# Patient Record
Sex: Female | Born: 1945 | Race: Black or African American | Hispanic: No | Marital: Married | Smoking: Never smoker
Health system: Southern US, Community
[De-identification: ages and names within clinical notes are randomized; demographics above are authoritative.]

## PROBLEM LIST (undated history)

## (undated) DIAGNOSIS — I10 Essential (primary) hypertension: Secondary | ICD-10-CM

## (undated) DIAGNOSIS — J189 Pneumonia, unspecified organism: Secondary | ICD-10-CM

## (undated) HISTORY — PX: BACK SURGERY: SHX140

## (undated) HISTORY — PX: CERVICAL FUSION: SHX112

## (undated) HISTORY — PX: LAMINECTOMY: SHX219

---

## 1997-08-08 HISTORY — PX: LUMBAR FUSION: SHX111

## 2018-07-08 DIAGNOSIS — E0590 Thyrotoxicosis, unspecified without thyrotoxic crisis or storm: Secondary | ICD-10-CM

## 2018-07-08 HISTORY — DX: Thyrotoxicosis, unspecified without thyrotoxic crisis or storm: E05.90

## 2020-09-24 ENCOUNTER — Ambulatory Visit: Payer: Self-pay

## 2020-09-24 ENCOUNTER — Encounter: Payer: Self-pay | Admitting: Specialist

## 2020-09-24 ENCOUNTER — Other Ambulatory Visit: Payer: Self-pay

## 2020-09-24 ENCOUNTER — Ambulatory Visit: Payer: Medicare HMO | Admitting: Specialist

## 2020-09-24 VITALS — BP 109/65 | HR 69 | Ht 65.0 in | Wt 201.0 lb

## 2020-09-24 DIAGNOSIS — M5410 Radiculopathy, site unspecified: Secondary | ICD-10-CM

## 2020-09-24 DIAGNOSIS — Z981 Arthrodesis status: Secondary | ICD-10-CM

## 2020-09-24 DIAGNOSIS — M2392 Unspecified internal derangement of left knee: Secondary | ICD-10-CM

## 2020-09-24 DIAGNOSIS — M545 Low back pain, unspecified: Secondary | ICD-10-CM

## 2020-09-24 DIAGNOSIS — M4316 Spondylolisthesis, lumbar region: Secondary | ICD-10-CM | POA: Diagnosis not present

## 2020-09-24 NOTE — Progress Notes (Signed)
Office Visit Note   Patient: Meghan Gallegos           Date of Birth: [birth year]           MRN: [id number] Visit Date: 09/24/2020              Requested by: No referring provider defined for this encounter. PCP: Nicolasa Ducking, MD   Assessment & Plan: Visit Diagnoses:  1. Low back pain, unspecified back pain laterality, unspecified chronicity, unspecified whether sciatica present   2. Spondylolisthesis, lumbar region   3. History of lumbar fusion   4. Internal derangement of left knee   5. Radiculopathy, unspecified spinal region     Plan: Avoid bending, stooping and avoid lifting weights greater than 10 lbs. Avoid prolong standing and walking. Avoid frequent bending and stooping  No lifting greater than 10 lbs. May use ice or moist heat for pain. Weight loss is of benefit. Handicap license is approved. Dr. [place] Blas secretary/Assistant will call to arrange for epidural steroid injection   Follow-Up Instructions: Return in about 4 weeks (around 10/22/2020).   Orders:  Orders Placed This Encounter  Procedures  . XR Lumbar Spine 2-3 Views  . Ambulatory referral to Physical Medicine Rehab   No orders of the defined types were placed in this encounter.     Procedures: No procedures performed   Clinical Data: No additional findings.   Subjective: Chief Complaint  Patient presents with  . Lower Back - Pain    75 year old female with previous history of lumbar surgery in 1999 by a surgeon in [place]. Post op the lumbar spine continued to give her trouble and she was unable to return to work due to ongoing back pain. Over the last 7 months the pain is changed to left posterior and lateral thigh and knee and calf pain and pain into the left toes and the sole of the left foot. The pain increased. She has been in pain management and seeing MDs since 2000, In 2015 she moved from [place] to  [place] and is seeing the doctors there. No bowel or bladder difficulty. The left leg pain is  greater than the back pain. The meds she takes helps the pain in the back but not into the left leg. Has seen an orthopaedic surgeon sports med for the left knee the pain is not as would be seen with the MRI report. She had an ALF in [place] after first having a laminectomy and then had a recurrent disc herniation and had lamiinectomy then fusion the same day. She can walk a distance of short period but has difficulty with shopping and walking the entire grocery store without use of a cart to lean on. The pain into the left leg is worse with standing and walking and straightening the left knee improves the pain. Injection of the left knee helps to relieve the left knee pain and the use of a viscoelastic supplement helped for 3 months.    Review of Systems  Constitutional: Negative.   HENT: Negative.   Eyes: Negative.   Respiratory: Positive for shortness of breath and wheezing. Negative for apnea, cough, choking and chest tightness.   Cardiovascular: Negative.  Negative for chest pain, palpitations and leg swelling.  Gastrointestinal: Negative.  Negative for abdominal distention, abdominal pain, anal bleeding, blood in stool, constipation, diarrhea, nausea and rectal pain.  Endocrine: Negative.  Negative for cold intolerance, heat intolerance, polydipsia, polyphagia and polyuria.  Genitourinary: Negative.  Negative for difficulty  urinating, dyspareunia, dysuria, enuresis, flank pain, frequency and genital sores.  Musculoskeletal: Positive for arthralgias, back pain and gait problem. Negative for joint swelling, myalgias, neck pain and neck stiffness.  Skin: Negative.  Negative for color change, pallor, rash and wound.  Allergic/Immunologic: Positive for environmental allergies.  Neurological: Positive for weakness and numbness. Negative for dizziness, tremors, seizures, syncope, facial asymmetry, speech difficulty, light-headedness and headaches.  Hematological: Negative.  Negative for adenopathy. Does  not bruise/bleed easily.  Psychiatric/Behavioral: Negative.  Negative for agitation, behavioral problems, confusion, decreased concentration, dysphoric mood, hallucinations, self-injury, sleep disturbance and suicidal ideas. The patient is not nervous/anxious and is not hyperactive.      Objective: Vital Signs: BP 109/65 (BP Location: Left Arm, Patient Position: Sitting)   Pulse 69   Ht 5\' 5"  (1.651 m)   Wt 201 lb (91.2 kg)   BMI 33.45 kg/m   Physical Exam  Ortho Exam  Specialty Comments:  No specialty comments available.  Imaging: No results found.   PMFS History: There are no problems to display for this patient.  History reviewed. No pertinent past medical history.  History reviewed. No pertinent family history.  History reviewed. No pertinent surgical history. Social History   Occupational History  . Not on file  Tobacco Use  . Smoking status: Never Smoker  . Smokeless tobacco: Never Used  Vaping Use  . Vaping Use: Not on file  Substance and Sexual Activity  . Alcohol use: Never  . Drug use: Never  . Sexual activity: Not on file

## 2020-09-24 NOTE — Patient Instructions (Signed)
Avoid bending, stooping and avoid lifting weights greater than 10 lbs. Avoid prolong standing and walking. Avoid frequent bending and stooping  No lifting greater than 10 lbs. May use ice or moist heat for pain. Weight loss is of benefit. Handicap license is approved. Dr. Newton's secretary/Assistant will call to arrange for epidural steroid injection  

## 2020-09-29 ENCOUNTER — Telehealth: Payer: Self-pay | Admitting: Physical Medicine and Rehabilitation

## 2020-09-29 NOTE — Telephone Encounter (Signed)
Patient called to schedule an appointment with Dr. Alvester Morin. Patient said she received a text to call and make an appointment. The number to contact patient is  [phone]

## 2020-09-29 NOTE — Telephone Encounter (Signed)
Pt called stating she would like to go ahead and get her appt for a nerve block scheduled.  [phone]

## 2020-09-29 NOTE — Telephone Encounter (Signed)
Called pt and sch 3/7 

## 2020-09-29 NOTE — Telephone Encounter (Signed)
Called pt and sch 3/7

## 2020-10-12 ENCOUNTER — Encounter: Payer: Self-pay | Admitting: Physical Medicine and Rehabilitation

## 2020-10-12 ENCOUNTER — Ambulatory Visit: Payer: Self-pay

## 2020-10-12 ENCOUNTER — Other Ambulatory Visit: Payer: Self-pay

## 2020-10-12 ENCOUNTER — Ambulatory Visit (INDEPENDENT_AMBULATORY_CARE_PROVIDER_SITE_OTHER): Payer: Medicare HMO | Admitting: Physical Medicine and Rehabilitation

## 2020-10-12 VITALS — BP 120/74 | HR 76

## 2020-10-12 DIAGNOSIS — M5416 Radiculopathy, lumbar region: Secondary | ICD-10-CM | POA: Diagnosis not present

## 2020-10-12 MED ORDER — METHYLPREDNISOLONE ACETATE 80 MG/ML IJ SUSP
80.0000 mg | Freq: Once | INTRAMUSCULAR | Status: AC
  Administered 2020-10-12: 80 mg

## 2020-10-12 NOTE — Progress Notes (Signed)
Pt state lower back pain that travels down the positier left leg to her foot. Pt state walking, standing and sitting makes the pain worse. Pt state she takes pain meds to help ease the pain.  Numeric Pain Rating Scale and Functional Assessment Average Pain 4   In the last MONTH (on 0-10 scale) has pain interfered with the following?  1. General activity like being  able to carry out your everyday physical activities such as walking, climbing stairs, carrying groceries, or moving a chair?  Rating(7)   +Driver, -BT, -Dye Allergies.

## 2020-10-12 NOTE — Patient Instructions (Signed)

## 2020-10-13 NOTE — Progress Notes (Signed)
Meghan Gallegos - 75 y.o. female MRN [id number]  Date of birth: 13-Mar-1946  Office Visit Note: Visit Date: 10/12/2020 PCP: Nicolasa Ducking, MD Referred by: Nicolasa Ducking, MD  Subjective: Chief Complaint  Patient presents with  . Lower Back - Pain  . Left Leg - Pain  . Left Foot - Pain   HPI:  Meghan Gallegos is a 75 y.o. female who comes in today at the request of Dr. Vira Browns for planned Left L4-L5 Lumbar epidural steroid injection with fluoroscopic guidance.  The patient has failed conservative care including home exercise, medications, time and activity modification.  This injection will be diagnostic and hopefully therapeutic.  Please see requesting physician notes for further details and justification. MRI reviewed with images and spine model.  MRI reviewed in the note below.  Prior lumbar fusion at L5-S1.  Symptoms left posterior lateral leg pain more of an L5-S1 distribution.   ROS Otherwise per HPI.  Assessment & Plan: Visit Diagnoses:    ICD-10-CM   1. Lumbar radiculopathy  M54.16 XR C-ARM NO REPORT    Epidural Steroid injection    methylPREDNISolone acetate (DEPO-MEDROL) injection 80 mg    Plan: No additional findings.   Meds & Orders:  Meds ordered this encounter  Medications  . methylPREDNISolone acetate (DEPO-MEDROL) injection 80 mg    Orders Placed This Encounter  Procedures  . XR C-ARM NO REPORT  . Epidural Steroid injection    Follow-up: Return in about 2 weeks (around 10/26/2020) for Vira Browns, MD.   Procedures: No procedures performed  Lumbosacral Transforaminal Epidural Steroid Injection - Sub-Pedicular Approach with Fluoroscopic Guidance  Patient: Meghan Gallegos      Date of Birth: 08-03-1946 MRN: [id number] PCP: Nicolasa Ducking, MD      Visit Date: 10/12/2020   Universal Protocol:    Date/Time: 10/12/2020  Consent Given By: the patient  Position: PRONE  Additional Comments: Vital signs were monitored before and after the procedure. Patient  was prepped and draped in the usual sterile fashion. The correct patient, procedure, and site was verified.   Injection Procedure Details:   Procedure diagnoses: Lumbar radiculopathy [M54.16]    Meds Administered:  Meds ordered this encounter  Medications  . methylPREDNISolone acetate (DEPO-MEDROL) injection 80 mg    Laterality: Left  Location/Site:  L4-L5  Needle:5.0 in., 22 ga.  Short bevel or Quincke spinal needle  Needle Placement: Transforaminal  Findings:    -Comments: Excellent flow of contrast along the nerve, nerve root and into the epidural space.  Procedure Details: After squaring off the end-plates to get a true AP view, the C-arm was positioned so that an oblique view of the foramen as noted above was visualized. The target area is just inferior to the "nose of the scotty dog" or sub pedicular. The soft tissues overlying this structure were infiltrated with 2-3 ml. of 1% Lidocaine without Epinephrine.  The spinal needle was inserted toward the target using a "trajectory" view along the fluoroscope beam.  Under AP and lateral visualization, the needle was advanced so it did not puncture dura and was located close the 6 O'Clock position of the pedical in AP tracterory. Biplanar projections were used to confirm position. Aspiration was confirmed to be negative for CSF and/or blood. A 1-2 ml. volume of Isovue-250 was injected and flow of contrast was noted at each level. Radiographs were obtained for documentation purposes.   After attaining the desired flow of contrast documented above, a 0.5 to 1.0 ml test dose of  0.25% Marcaine was injected into each respective transforaminal space.  The patient was observed for 90 seconds post injection.  After no sensory deficits were reported, and normal lower extremity motor function was noted,   the above injectate was administered so that equal amounts of the injectate were placed at each foramen (level) into the transforaminal  epidural space.   Additional Comments:  The patient tolerated the procedure well Dressing: 2 x 2 sterile gauze and Band-Aid    Post-procedure details: Patient was observed during the procedure. Post-procedure instructions were reviewed.  Patient left the clinic in stable condition.      Clinical History: MRI LUMBAR SPINE WITHOUT AND WITH CONTRAST   TECHNIQUE:  Multiplanar and multiecho pulse sequences of the lumbar spine were  obtained without and with intravenous contrast.   CONTRAST: 18 mL MultiHance IV   COMPARISON: None.   FINDINGS:  Segmentation: Normal   Alignment: 5 mm anterolisthesis L4-5. Mild retrolisthesis L5-S1.   Vertebrae: Negative for fracture or mass.   Conus medullaris and cauda equina: Conus extends to the L1-2 level.  Conus and cauda equina appear normal.   Paraspinal and other soft tissues: Negative for paraspinous mass or  adenopathy. Multiplebilateral renal cysts.   Disc levels:   L1-2: Negative   L2-3: Mild facet degeneration. Normal disc space. Negative for  stenosis   L3-4: Normal disc space. Mild facet degeneration. Negative for  stenosis   L4-5: 5 mm anterolisthesis. No disc protrusion. Moderate to advanced  facet hypertrophy bilaterally with no significant spinal or  foraminal stenosis   L5-S1: Bilateral rate cage fusion. Posterior laminectomy. Negative  for spinal or foraminal stenosis.   IMPRESSION:  5 mm anterolisthesis L4-5 with moderate to advanced facet  degeneration. No significant stenosis.   Bilateral rate cage fusion L5-S1. Negative for stenosis.    Electronically Signed   By: Marlan Palau M.D.   On: 06/14/2020 15:25     Objective:  VS:  HT:    WT:   BMI:     BP:120/74  HR:76bpm  TEMP: ( )  RESP:  Physical Exam Vitals and nursing note reviewed.  Constitutional:      General: She is not in acute distress.    Appearance: Normal appearance. She is not ill-appearing.  HENT:     Head:  Normocephalic and atraumatic.     Right Ear: External ear normal.     Left Ear: External ear normal.  Eyes:     Extraocular Movements: Extraocular movements intact.  Cardiovascular:     Rate and Rhythm: Normal rate.     Pulses: Normal pulses.  Pulmonary:     Effort: Pulmonary effort is normal. No respiratory distress.  Abdominal:     General: There is no distension.     Palpations: Abdomen is soft.  Musculoskeletal:        General: Tenderness present.     Cervical back: Neck supple.     Right lower leg: No edema.     Left lower leg: No edema.     Comments: Patient has good distal strength with no pain over the greater trochanters.  No clonus or focal weakness.  Skin:    Findings: No erythema, lesion or rash.  Neurological:     General: No focal deficit present.     Mental Status: She is alert and oriented to person, place, and time.     Sensory: No sensory deficit.     Motor: No weakness or abnormal muscle tone.  Coordination: Coordination normal.  Psychiatric:        Mood and Affect: Mood normal.        Behavior: Behavior normal.      Imaging: XR C-ARM NO REPORT  Result Date: 10/12/2020 Please see Notes tab for imaging impression.

## 2020-10-13 NOTE — Procedures (Signed)
Lumbosacral Transforaminal Epidural Steroid Injection - Sub-Pedicular Approach with Fluoroscopic Guidance  Patient: Meghan Gallegos      Date of Birth: 11/06/1945 MRN: [id number] PCP: Nicolasa Ducking, MD      Visit Date: 10/12/2020   Universal Protocol:    Date/Time: 10/12/2020  Consent Given By: the patient  Position: PRONE  Additional Comments: Vital signs were monitored before and after the procedure. Patient was prepped and draped in the usual sterile fashion. The correct patient, procedure, and site was verified.   Injection Procedure Details:   Procedure diagnoses: Lumbar radiculopathy [M54.16]    Meds Administered:  Meds ordered this encounter  Medications  . methylPREDNISolone acetate (DEPO-MEDROL) injection 80 mg    Laterality: Left  Location/Site:  L4-L5  Needle:5.0 in., 22 ga.  Short bevel or Quincke spinal needle  Needle Placement: Transforaminal  Findings:    -Comments: Excellent flow of contrast along the nerve, nerve root and into the epidural space.  Procedure Details: After squaring off the end-plates to get a true AP view, the C-arm was positioned so that an oblique view of the foramen as noted above was visualized. The target area is just inferior to the "nose of the scotty dog" or sub pedicular. The soft tissues overlying this structure were infiltrated with 2-3 ml. of 1% Lidocaine without Epinephrine.  The spinal needle was inserted toward the target using a "trajectory" view along the fluoroscope beam.  Under AP and lateral visualization, the needle was advanced so it did not puncture dura and was located close the 6 O'Clock position of the pedical in AP tracterory. Biplanar projections were used to confirm position. Aspiration was confirmed to be negative for CSF and/or blood. A 1-2 ml. volume of Isovue-250 was injected and flow of contrast was noted at each level. Radiographs were obtained for documentation purposes.   After attaining the desired  flow of contrast documented above, a 0.5 to 1.0 ml test dose of 0.25% Marcaine was injected into each respective transforaminal space.  The patient was observed for 90 seconds post injection.  After no sensory deficits were reported, and normal lower extremity motor function was noted,   the above injectate was administered so that equal amounts of the injectate were placed at each foramen (level) into the transforaminal epidural space.   Additional Comments:  The patient tolerated the procedure well Dressing: 2 x 2 sterile gauze and Band-Aid    Post-procedure details: Patient was observed during the procedure. Post-procedure instructions were reviewed.  Patient left the clinic in stable condition.

## 2020-11-06 ENCOUNTER — Ambulatory Visit: Payer: Medicare HMO | Admitting: Specialist

## 2020-11-06 ENCOUNTER — Encounter: Payer: Self-pay | Admitting: Specialist

## 2020-11-06 ENCOUNTER — Other Ambulatory Visit: Payer: Self-pay

## 2020-11-06 VITALS — BP 132/63 | HR 57 | Ht 65.0 in | Wt 201.0 lb

## 2020-11-06 DIAGNOSIS — Z4889 Encounter for other specified surgical aftercare: Secondary | ICD-10-CM

## 2020-11-06 DIAGNOSIS — Z981 Arthrodesis status: Secondary | ICD-10-CM | POA: Diagnosis not present

## 2020-11-06 DIAGNOSIS — M4316 Spondylolisthesis, lumbar region: Secondary | ICD-10-CM | POA: Diagnosis not present

## 2020-11-06 DIAGNOSIS — M5410 Radiculopathy, site unspecified: Secondary | ICD-10-CM | POA: Diagnosis not present

## 2020-11-06 DIAGNOSIS — M4807 Spinal stenosis, lumbosacral region: Secondary | ICD-10-CM

## 2020-11-06 MED ORDER — MELOXICAM 15 MG PO TABS: 15.0000 mg | ORAL_TABLET | Freq: Every day | ORAL | 2 refills | Status: DC

## 2020-11-06 NOTE — Progress Notes (Signed)
Office Visit Note   Patient: Meghan Gallegos           Date of Birth: 08-18-1945           MRN: [id number] Visit Date: 11/06/2020              Requested by: Nicolasa Ducking, MD [address] [place],  [place] [address] PCP: Nicolasa Ducking, MD   Assessment & Plan: Visit Diagnoses:  1. Spondylolisthesis, lumbar region   2. History of lumbar fusion   3. Radiculopathy, unspecified spinal region   4. Encounter for other specified surgical aftercare   5. Spinal stenosis of lumbosacral region   75 year  Old female with chronic back and leg pain and chronic pain management, She underwent recent radiographs and MRI demonstrating an L4-5 spondylolisthesis but MRI did not demonstrate nerve compression. Her radiographs show a more significant  Spondylolisthesis than is seen in the MRI and it is likely that there is a dynamic shifing that is occurring when she is upright and with  Bending. A lumbar Myelogram and post myelogram CT may be more helpful in demonstrating the nerve compression often present with the degree of spondylolithesis seen her. The SNRB was not able to relief a majority of the pain she is experiencing so I am recommending a myelogram and post myelogram CT with upright mylelogram images to demontrate the degree of spondylolishesis and nerve compression.  Plan: Avoid bending, stooping and avoid lifting weights greater than 10 lbs. Avoid prolong standing and walking. Avoid frequent bending and stooping  No lifting greater than 10 lbs. May use ice or moist heat for pain. Weight loss is of benefit. Handicap license is approved. Lumbar myelogram and post myelogram CT scan to assess. Follow-Up Instructions: Return in about 4 weeks (around 12/04/2020).   Orders:  No orders of the defined types were placed in this encounter.  No orders of the defined types were placed in this encounter.     Procedures: No procedures performed   Clinical Data: No additional  findings.   Subjective: Chief Complaint  Patient presents with  . Lower Back - Follow-up    She had a Lt L4-5 Tf injection on 10/12/20 with Dr. Alvester Morin and she states that she got 30% relief from the injection    75 year old female with history of back pain and radiation into the left leg. She is taking hydrocodone and morphine every 4 hours from primary care in [place] controling the narcotics. She had injection left L4-5 by Dr. Alvester Morin TF L4 and she reports that the pain in the left  Leg was not improved but she is still having tightness in the left posterior thigh but the pain is still there. She is not having bladder difficulty but does take a natural remedy for constipation. She has limitation of her standing and walking tolerance. 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 Scar from first surgery is of concern but not sure that it causes all her pain. I year ago the pain worsened.    Review of Systems  Constitutional: Negative.   HENT: Negative.   Eyes: Negative.   Respiratory: Negative.   Cardiovascular: Negative.   Gastrointestinal: Negative.   Endocrine: Negative.   Genitourinary: Negative.   Musculoskeletal: Negative.   Skin: Negative.   Allergic/Immunologic: Negative.   Neurological: Negative.   Hematological: Negative.   Psychiatric/Behavioral: Negative.      Objective: Vital Signs: BP 132/63 (BP Location: Left Arm, Patient Position: Sitting)  Pulse (!) 57   Ht 5\' 5"  (1.651 m)   Wt 201 lb (91.2 kg)   BMI 33.45 kg/m   Physical Exam Constitutional:      Appearance: She is well-developed.  HENT:     Head: Normocephalic and atraumatic.  Eyes:     Pupils: Pupils are equal, round, and reactive to light.  Pulmonary:     Effort: Pulmonary effort is normal.     Breath sounds: Normal breath sounds.  Abdominal:     General: Bowel sounds are normal.     Palpations: Abdomen is soft.  Musculoskeletal:     Cervical back: Normal range of motion and neck supple.      Lumbar back: Negative right straight leg raise test and negative left straight leg raise test.  Skin:    General: Skin is warm and dry.  Neurological:     Mental Status: She is alert and oriented to person, place, and time.  Psychiatric:        Behavior: Behavior normal.        Thought Content: Thought content normal.        Judgment: Judgment normal.     Back Exam   Tenderness  The patient is experiencing tenderness in the lumbar.  Range of Motion  Extension: abnormal  Flexion: normal  Lateral bend right: normal  Lateral bend left: normal  Rotation right: abnormal  Rotation left: abnormal   Muscle Strength  Right Quadriceps:  5/5  Right Hamstrings:  5/5   Tests  Straight leg raise right: negative Straight leg raise left: negative  Other  Toe walk: normal Heel walk: normal Sensation: normal Gait: normal       Specialty Comments:  No specialty comments available.  Imaging: No results found.   PMFS History: There are no problems to display for this patient.  No past medical history on file.  No family history on file.  No past surgical history on file. Social History   Occupational History  . Not on file  Tobacco Use  . Smoking status: Never Smoker  . Smokeless tobacco: Never Used  Vaping Use  . Vaping Use: Not on file  Substance and Sexual Activity  . Alcohol use: Never  . Drug use: Never  . Sexual activity: Not on file

## 2020-11-06 NOTE — Patient Instructions (Signed)
Plan: Avoid bending, stooping and avoid lifting weights greater than 10 lbs. Avoid prolong standing and walking. Avoid frequent bending and stooping  No lifting greater than 10 lbs. May use ice or moist heat for pain. Weight loss is of benefit. Handicap license is approved. Lumbar myelogram and post myelogram CT scan to assess.

## 2021-02-12 ENCOUNTER — Encounter: Payer: Self-pay | Admitting: Specialist

## 2021-02-12 ENCOUNTER — Other Ambulatory Visit: Payer: Self-pay

## 2021-02-12 ENCOUNTER — Ambulatory Visit: Payer: Medicare HMO | Admitting: Specialist

## 2021-02-12 VITALS — BP 122/67 | HR 64 | Ht 65.0 in | Wt 201.0 lb

## 2021-02-12 DIAGNOSIS — M4316 Spondylolisthesis, lumbar region: Secondary | ICD-10-CM | POA: Diagnosis not present

## 2021-02-12 DIAGNOSIS — M5410 Radiculopathy, site unspecified: Secondary | ICD-10-CM

## 2021-02-12 DIAGNOSIS — M4807 Spinal stenosis, lumbosacral region: Secondary | ICD-10-CM

## 2021-02-12 DIAGNOSIS — M2392 Unspecified internal derangement of left knee: Secondary | ICD-10-CM | POA: Diagnosis not present

## 2021-02-12 DIAGNOSIS — Z4889 Encounter for other specified surgical aftercare: Secondary | ICD-10-CM

## 2021-02-12 DIAGNOSIS — Z981 Arthrodesis status: Secondary | ICD-10-CM

## 2021-02-12 DIAGNOSIS — M545 Low back pain, unspecified: Secondary | ICD-10-CM

## 2021-02-12 NOTE — Progress Notes (Signed)
Office Visit Note   Patient: Meghan Gallegos           Date of Birth: 1946/04/10           MRN: [id number] Visit Date: 02/12/2021              Requested by: Nicolasa Ducking, MD [address] [place],  [place] [address] PCP: Nicolasa Ducking, MD   Assessment & Plan: Visit Diagnoses:  1. Spondylolisthesis, lumbar region   2. History of lumbar fusion   3. Spinal stenosis of lumbosacral region   4. Internal derangement of left knee   5. Radiculopathy, unspecified spinal region   6. Low back pain, unspecified back pain laterality, unspecified chronicity, unspecified whether sciatica present   7. Encounter for other specified surgical aftercare       Plan:Avoid bending, stooping and avoid lifting weights greater than 10 lbs. Avoid prolong standing and walking. Avoid frequent bending and stooping  No lifting greater than 10 lbs. May use ice or moist heat for pain. Weight loss is of benefit. Handicap license is approved. Dr. [place] Blas secretary/Assistant will call to arrange for epidural steroid injection   You have spinal narrowing and nerve compression within the opening at the level of the shift or anterolisthesis  At L4-5, your neurologic exam is normal, Surgery would improve your standing and walking tolerance but will not  Make your spine normal. 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  Lumbar flexion exercises.Low Back Sprain or Strain Rehab Ask your health care provider which exercises are safe for you. Do exercises exactly as told by your health care provider and adjust them as directed. It is normal to feel mild stretching, pulling, tightness, or discomfort as you do these exercises. Stop right away if you feel sudden pain or your pain gets worse. Do not begin these exercises until told by your health care  provider. Stretching and range-of-motion exercises These exercises warm up your muscles and joints and improve the movement and flexibility of your back. These exercises also help to relieve pain, numbness,and tingling. Lumbar rotation  Lie on your back on a firm surface and bend your knees. Straighten your arms out to your sides so each arm forms a 90-degree angle (right angle) with a side of your body. Slowly move (rotate) both of your knees to one side of your body until you feel a stretch in your lower back (lumbar). Try not to let your shoulders lift off the floor. Hold this position for __________ seconds. Tense your abdominal muscles and slowly move your knees back to the starting position. Repeat this exercise on the other side of your body. Repeat __________ times. Complete this exercise __________ times a day. Single knee to chest  Lie on your back on a firm surface with both legs straight. Bend one of your knees. Use your hands to move your knee up toward your chest until you feel a gentle stretch in your lower back and buttock. Hold your leg in this position by holding on to the front of your knee. Keep your other leg as straight as possible. Hold this position for __________ seconds. Slowly return to the starting position. Repeat with your other leg. Repeat __________ times. Complete this exercise __________ times a day. Prone extension on elbows  Lie on your abdomen on a firm surface (  prone position). Prop yourself up on your elbows. Use your arms to help lift your chest up until you feel a gentle stretch in your abdomen and your lower back. This will place some of your body weight on your elbows. If this is uncomfortable, try stacking pillows under your chest. Your hips should stay down, against the surface that you are lying on. Keep your hip and back muscles relaxed. Hold this position for __________ seconds. Slowly relax your upper body and return to the starting  position. Repeat __________ times. Complete this exercise __________ times a day. Strengthening exercises These exercises build strength and endurance in your back. Endurance is theability to use your muscles for a long time, even after they get tired. Pelvic tilt This exercise strengthens the muscles that lie deep in the abdomen. Lie on your back on a firm surface. Bend your knees and keep your feet flat on the floor. Tense your abdominal muscles. Tip your pelvis up toward the ceiling and flatten your lower back into the floor. To help with this exercise, you may place a small towel under your lower back and try to push your back into the towel. Hold this position for __________ seconds. Let your muscles relax completely before you repeat this exercise. Repeat __________ times. Complete this exercise __________ times a day. Alternating arm and leg raises  Get on your hands and knees on a firm surface. If you are on a hard floor, you may want to use padding, such as an exercise mat, to cushion your knees. Line up your arms and legs. Your hands should be directly below your shoulders, and your knees should be directly below your hips. Lift your left leg behind you. At the same time, raise your right arm and straighten it in front of you. Do not lift your leg higher than your hip. Do not lift your arm higher than your shoulder. Keep your abdominal and back muscles tight. Keep your hips facing the ground. Do not arch your back. Keep your balance carefully, and do not hold your breath. Hold this position for __________ seconds. Slowly return to the starting position. Repeat with your right leg and your left arm. Repeat __________ times. Complete this exercise __________ times a day. Abdominal set with straight leg raise  Lie on your back on a firm surface. Bend one of your knees and keep your other leg straight. Tense your abdominal muscles and lift your straight leg up, 4-6 inches (10-15  cm) off the ground. Keep your abdominal muscles tight and hold this position for __________ seconds. Do not hold your breath. Do not arch your back. Keep it flat against the ground. Keep your abdominal muscles tense as you slowly lower your leg back to the starting position. Repeat with your other leg. Repeat __________ times. Complete this exercise __________ times a day. Single leg lower with bent knees Lie on your back on a firm surface. Tense your abdominal muscles and lift your feet off the floor, one foot at a time, so your knees and hips are bent in 90-degree angles (right angles). Your knees should be over your hips and your lower legs should be parallel to the floor. Keeping your abdominal muscles tense and your knee bent, slowly lower one of your legs so your toe touches the ground. Lift your leg back up to return to the starting position. Do not hold your breath. Do not let your back arch. Keep your back flat against the ground. Repeat with  your other leg. Repeat __________ times. Complete this exercise __________ times a day. Posture and body mechanics Good posture and healthy body mechanics can help to relieve stress in your body's tissues and joints. Body mechanics refers to the movements and positions of your body while you do your daily activities. Posture is part of body mechanics. Good posture means: Your spine is in its natural S-curve position (neutral). Your shoulders are pulled back slightly. Your head is not tipped forward. Follow these guidelines to improve your posture and body mechanics in youreveryday activities. Standing  When standing, keep your spine neutral and your feet about hip width apart. Keep a slight bend in your knees. Your ears, shoulders, and hips should line up. When you do a task in which you stand in one place for a long time, place one foot up on a stable object that is 2-4 inches (5-10 cm) high, such as a footstool. This helps keep your spine  neutral.  Sitting  When sitting, keep your spine neutral and keep your feet flat on the floor. Use a footrest, if necessary, and keep your thighs parallel to the floor. Avoid rounding your shoulders, and avoid tilting your head forward. When working at a desk or a computer, keep your desk at a height where your hands are slightly lower than your elbows. Slide your chair under your desk so you are close enough to maintain good posture. When working at a computer, place your monitor at a height where you are looking straight ahead and you do not have to tilt your head forward or downward to look at the screen.  Resting When lying down and resting, avoid positions that are most painful for you. If you have pain with activities such as sitting, bending, stooping, or squatting, lie in a position in which your body does not bend very much. For example, avoid curling up on your side with your arms and knees near your chest (fetal position). If you have pain with activities such as standing for a long time or reaching with your arms, lie with your spine in a neutral position and bend your knees slightly. Try the following positions: Lying on your side with a pillow between your knees. Lying on your back with a pillow under your knees. Lifting  When lifting objects, keep your feet at least shoulder width apart and tighten your abdominal muscles. Bend your knees and hips and keep your spine neutral. It is important to lift using the strength of your legs, not your back. Do not lock your knees straight out. Always ask for help to lift heavy or awkward objects.  This information is not intended to replace advice given to you by your health care provider. Make sure you discuss any questions you have with your healthcare provider. Document Revised: 11/16/2018 Document Reviewed: 08/16/2018 Elsevier Patient Education  2022 [employer]Up Instructions: Return in about 4 weeks (around 03/12/2021).   Orders:   Orders Placed This Encounter  Procedures   Ambulatory referral to Physical Medicine Rehab   No orders of the defined types were placed in this encounter.     Procedures: No procedures performed   Clinical Data: No additional findings.   Subjective: Chief Complaint  Patient presents with   Lower Back - Follow-up    CT Myelogram review of LSP    75 year old female with history of previous lumbar fusion L5-S1 in [place] in the 1990s. Which went on to heal, anterior Ray cage surgery.  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 The injection helped for about 2-3 months. She has undergone recent myelogram and post myelogram CT of the lumbar spine.    Review of Systems  Constitutional: Negative.   HENT: Negative.    Eyes: Negative.   Respiratory: Negative.    Cardiovascular: Negative.   Gastrointestinal: Negative.   Endocrine: Negative.   Genitourinary: Negative.   Musculoskeletal: Negative.   Skin: Negative.   Allergic/Immunologic: Negative.   Neurological: Negative.   Hematological: Negative.   Psychiatric/Behavioral: Negative.      Objective: Vital Signs: BP 122/67   Pulse 64   Ht 5\' 5"  (1.651 m)   Wt 201 lb (91.2 kg)   BMI 33.45 kg/m   Physical Exam Constitutional:      Appearance: She is well-developed.  HENT:     Head: Normocephalic and atraumatic.  Eyes:     Pupils: Pupils are equal, round, and reactive to light.  Pulmonary:     Effort: Pulmonary effort is normal.     Breath sounds: Normal breath sounds.  Abdominal:     General: Bowel sounds are normal.     Palpations: Abdomen is soft.  Musculoskeletal:     Cervical back: Normal range of motion and neck supple.     Lumbar back: Negative right straight leg raise test and negative left straight leg raise test.  Skin:    General: Skin is warm and dry.   Neurological:     Mental Status: She is alert and oriented to person, place, and time.  Psychiatric:        Behavior: Behavior normal.        Thought Content: Thought content normal.        Judgment: Judgment normal.    Back Exam   Tenderness  The patient is experiencing tenderness in the lumbar.  Range of Motion  Extension:  abnormal  Flexion:  abnormal  Lateral bend right:  abnormal  Lateral bend left:  abnormal  Rotation right:  abnormal  Rotation left:  abnormal   Muscle Strength  Left Quadriceps:  5/5  Right Hamstrings:  5/5  Left Hamstrings:  5/5   Tests  Straight leg raise right: negative Straight leg raise left: negative  Other  Toe walk: normal Heel walk: normal Sensation: normal Erythema: no back redness Scars: present     Specialty Comments:  No specialty comments available.  Imaging: No results found.   PMFS History: There are no problems to display for this patient.  History reviewed. No pertinent past medical history.  History reviewed. No pertinent family history.  History reviewed. No pertinent surgical history. Social History   Occupational History   Not on file  Tobacco Use   Smoking status: Never   Smokeless tobacco: Never  Vaping Use   Vaping Use: Not on file  Substance and Sexual Activity   Alcohol use: Never   Drug use: Never   Sexual activity: Not on file

## 2021-02-12 NOTE — Patient Instructions (Signed)
Avoid bending, stooping and avoid lifting weights greater than 10 lbs. Avoid prolong standing and walking. Avoid frequent bending and stooping  No lifting greater than 10 lbs. May use ice or moist heat for pain. Weight loss is of benefit. Handicap license is approved. Dr. [place] Blas secretary/Assistant will call to arrange for epidural steroid injection   You have spinal narrowing and nerve compression within the opening at the level of the shift or anterolisthesis  At L4-5, your neurologic exam is normal, Surgery would improve your standing and walking tolerance but will not  Make your spine normal. 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  Lumbar flexion exercises.Low Back Sprain or Strain Rehab Ask your health care provider which exercises are safe for you. Do exercises exactly as told by your health care provider and adjust them as directed. It is normal to feel mild stretching, pulling, tightness, or discomfort as you do these exercises. Stop right away if you feel sudden pain or your pain gets worse. Do not begin these exercises until told by your health care provider. Stretching and range-of-motion exercises These exercises warm up your muscles and joints and improve the movement and flexibility of your back. These exercises also help to relieve pain, numbness,and tingling. Lumbar rotation  Lie on your back on a firm surface and bend your knees. Straighten your arms out to your sides so each arm forms a 90-degree angle (right angle) with a side of your body. Slowly move (rotate) both of your knees to one side of your body until you feel a stretch in your lower back (lumbar). Try not to let your shoulders lift off the floor. Hold this position for __________ seconds. Tense your abdominal muscles and slowly move your knees back to  the starting position. Repeat this exercise on the other side of your body. Repeat __________ times. Complete this exercise __________ times a day. Single knee to chest  Lie on your back on a firm surface with both legs straight. Bend one of your knees. Use your hands to move your knee up toward your chest until you feel a gentle stretch in your lower back and buttock. Hold your leg in this position by holding on to the front of your knee. Keep your other leg as straight as possible. Hold this position for __________ seconds. Slowly return to the starting position. Repeat with your other leg. Repeat __________ times. Complete this exercise __________ times a day. Prone extension on elbows  Lie on your abdomen on a firm surface (prone position). Prop yourself up on your elbows. Use your arms to help lift your chest up until you feel a gentle stretch in your abdomen and your lower back. This will place some of your body weight on your elbows. If this is uncomfortable, try stacking pillows under your chest. Your hips should stay down, against the surface that you are lying on. Keep your hip and back muscles relaxed. Hold this position for __________ seconds. Slowly relax your upper body and return to the starting position. Repeat __________ times. Complete this exercise __________ times a day. Strengthening exercises These exercises build strength and endurance in your back. Endurance is theability to use your muscles for a long time, even after they get tired. Pelvic tilt This exercise strengthens the muscles that lie deep in the abdomen. Lie on your  back on a firm surface. Bend your knees and keep your feet flat on the floor. Tense your abdominal muscles. Tip your pelvis up toward the ceiling and flatten your lower back into the floor. To help with this exercise, you may place a small towel under your lower back and try to push your back into the towel. Hold this position for __________  seconds. Let your muscles relax completely before you repeat this exercise. Repeat __________ times. Complete this exercise __________ times a day. Alternating arm and leg raises  Get on your hands and knees on a firm surface. If you are on a hard floor, you may want to use padding, such as an exercise mat, to cushion your knees. Line up your arms and legs. Your hands should be directly below your shoulders, and your knees should be directly below your hips. Lift your left leg behind you. At the same time, raise your right arm and straighten it in front of you. Do not lift your leg higher than your hip. Do not lift your arm higher than your shoulder. Keep your abdominal and back muscles tight. Keep your hips facing the ground. Do not arch your back. Keep your balance carefully, and do not hold your breath. Hold this position for __________ seconds. Slowly return to the starting position. Repeat with your right leg and your left arm. Repeat __________ times. Complete this exercise __________ times a day. Abdominal set with straight leg raise  Lie on your back on a firm surface. Bend one of your knees and keep your other leg straight. Tense your abdominal muscles and lift your straight leg up, 4-6 inches (10-15 cm) off the ground. Keep your abdominal muscles tight and hold this position for __________ seconds. Do not hold your breath. Do not arch your back. Keep it flat against the ground. Keep your abdominal muscles tense as you slowly lower your leg back to the starting position. Repeat with your other leg. Repeat __________ times. Complete this exercise __________ times a day. Single leg lower with bent knees Lie on your back on a firm surface. Tense your abdominal muscles and lift your feet off the floor, one foot at a time, so your knees and hips are bent in 90-degree angles (right angles). Your knees should be over your hips and your lower legs should be parallel to the  floor. Keeping your abdominal muscles tense and your knee bent, slowly lower one of your legs so your toe touches the ground. Lift your leg back up to return to the starting position. Do not hold your breath. Do not let your back arch. Keep your back flat against the ground. Repeat with your other leg. Repeat __________ times. Complete this exercise __________ times a day. Posture and body mechanics Good posture and healthy body mechanics can help to relieve stress in your body's tissues and joints. Body mechanics refers to the movements and positions of your body while you do your daily activities. Posture is part of body mechanics. Good posture means: Your spine is in its natural S-curve position (neutral). Your shoulders are pulled back slightly. Your head is not tipped forward. Follow these guidelines to improve your posture and body mechanics in youreveryday activities. Standing  When standing, keep your spine neutral and your feet about hip width apart. Keep a slight bend in your knees. Your ears, shoulders, and hips should line up. When you do a task in which you stand in one place for a long time, place  one foot up on a stable object that is 2-4 inches (5-10 cm) high, such as a footstool. This helps keep your spine neutral.  Sitting  When sitting, keep your spine neutral and keep your feet flat on the floor. Use a footrest, if necessary, and keep your thighs parallel to the floor. Avoid rounding your shoulders, and avoid tilting your head forward. When working at a desk or a computer, keep your desk at a height where your hands are slightly lower than your elbows. Slide your chair under your desk so you are close enough to maintain good posture. When working at a computer, place your monitor at a height where you are looking straight ahead and you do not have to tilt your head forward or downward to look at the screen.  Resting When lying down and resting, avoid positions that are  most painful for you. If you have pain with activities such as sitting, bending, stooping, or squatting, lie in a position in which your body does not bend very much. For example, avoid curling up on your side with your arms and knees near your chest (fetal position). If you have pain with activities such as standing for a long time or reaching with your arms, lie with your spine in a neutral position and bend your knees slightly. Try the following positions: Lying on your side with a pillow between your knees. Lying on your back with a pillow under your knees. Lifting  When lifting objects, keep your feet at least shoulder width apart and tighten your abdominal muscles. Bend your knees and hips and keep your spine neutral. It is important to lift using the strength of your legs, not your back. Do not lock your knees straight out. Always ask for help to lift heavy or awkward objects.  This information is not intended to replace advice given to you by your health care provider. Make sure you discuss any questions you have with your healthcare provider. Document Revised: 11/16/2018 Document Reviewed: 08/16/2018 Elsevier Patient Education  2022 [employer].

## 2021-02-16 ENCOUNTER — Other Ambulatory Visit: Payer: Self-pay | Admitting: Specialist

## 2021-02-17 ENCOUNTER — Other Ambulatory Visit: Payer: Self-pay | Admitting: Specialist

## 2021-02-17 ENCOUNTER — Telehealth: Payer: Self-pay | Admitting: Specialist

## 2021-02-17 MED ORDER — MELOXICAM 15 MG PO TABS: 15.0000 mg | ORAL_TABLET | Freq: Every day | ORAL | 2 refills | Status: AC

## 2021-02-17 NOTE — Telephone Encounter (Signed)
I called and spoke with Arlys John at [facility]. He states that they have sent multiple requests over for refill on patient's Meloxicam 15mg  tablet. I advised Dr. is out of the office this afternoon and rx is pended and awaiting his response.

## 2021-02-17 NOTE — Telephone Encounter (Signed)
[facility]. Patient is out of refills for Meloxicam and is asking for refill. Please advise.

## 2021-02-17 NOTE — Telephone Encounter (Signed)
Pt called requesting Dr. Otelia Sergeant assistance called her pharmacy [facility] in [place]. Pharmacy has been trying to contact Dr. Otelia Sergeant or assistance about a prescribe. Please call [facility] at [phone].

## 2021-02-19 ENCOUNTER — Other Ambulatory Visit: Payer: Self-pay | Admitting: Specialist

## 2021-02-24 ENCOUNTER — Other Ambulatory Visit: Payer: Self-pay

## 2021-02-24 ENCOUNTER — Ambulatory Visit: Payer: Medicare HMO | Admitting: Physical Medicine and Rehabilitation

## 2021-02-24 ENCOUNTER — Encounter: Payer: Self-pay | Admitting: Physical Medicine and Rehabilitation

## 2021-02-24 ENCOUNTER — Ambulatory Visit: Payer: Self-pay

## 2021-02-24 VITALS — BP 122/85 | HR 72

## 2021-02-24 DIAGNOSIS — M5416 Radiculopathy, lumbar region: Secondary | ICD-10-CM | POA: Diagnosis not present

## 2021-02-24 MED ORDER — METHYLPREDNISOLONE ACETATE 80 MG/ML IJ SUSP
80.0000 mg | Freq: Once | INTRAMUSCULAR | Status: AC
  Administered 2021-02-24: 80 mg

## 2021-02-24 NOTE — Progress Notes (Signed)
Pt state lower back pain that travels down to her left leg and foot. Pt state walking, standing and sitting makes the pain worse. Pt state she take pain meds and heating to help ease her pain. Pt has hx of inj on 10/12/20 pt state it helped.  Numeric Pain Rating Scale and Functional Assessment Average Pain 5   In the last MONTH (on 0-10 scale) has pain interfered with the following?  1. General activity like being  able to carry out your everyday physical activities such as walking, climbing stairs, carrying groceries, or moving a chair?  Rating(7)   +Driver, -BT, -Dye Allergies.

## 2021-02-24 NOTE — Patient Instructions (Signed)

## 2021-03-02 NOTE — Progress Notes (Signed)
Meghan Gallegos - 75 y.o. female MRN [id number]  Date of birth: 09-05-1945  Office Visit Note: Visit Date: 02/24/2021 PCP: Nicolasa Ducking, MD Referred by: Nicolasa Ducking, MD  Subjective: Chief Complaint  Patient presents with   Lower Back - Pain   Left Leg - Pain   Left Foot - Pain   HPI:  Meghan Gallegos is a 75 y.o. female who comes in today at the request of Dr. Vira Browns for planned Left L4-L5 Lumbar Transforaminal epidural steroid injection with fluoroscopic guidance.  The patient has failed conservative care including home exercise, medications, time and activity modification.  This injection will be diagnostic and hopefully therapeutic.  Please see requesting physician notes for further details and justification.   ROS Otherwise per HPI.  Assessment & Plan: Visit Diagnoses:    ICD-10-CM   1. Lumbar radiculopathy  M54.16 XR C-ARM NO REPORT    Epidural Steroid injection    methylPREDNISolone acetate (DEPO-MEDROL) injection 80 mg      Plan: No additional findings.   Meds & Orders:  Meds ordered this encounter  Medications   methylPREDNISolone acetate (DEPO-MEDROL) injection 80 mg    Orders Placed This Encounter  Procedures   XR C-ARM NO REPORT   Epidural Steroid injection    Follow-up: Return for Vira Browns, MD as scheduled.   Procedures: No procedures performed  Lumbosacral Transforaminal Epidural Steroid Injection - Sub-Pedicular Approach with Fluoroscopic Guidance  Patient: Meghan Gallegos      Date of Birth: [birth year] MRN: [id number] PCP: Nicolasa Ducking, MD      Visit Date: 02/24/2021   Universal Protocol:    Date/Time: 02/24/2021  Consent Given By: the patient  Position: PRONE  Additional Comments: Vital signs were monitored before and after the procedure. Patient was prepped and draped in the usual sterile fashion. The correct patient, procedure, and site was verified.   Injection Procedure Details:   Procedure diagnoses: Lumbar radiculopathy  [M54.16]    Meds Administered:  Meds ordered this encounter  Medications   methylPREDNISolone acetate (DEPO-MEDROL) injection 80 mg    Laterality: Left  Location/Site:  L4-L5  Needle:5.0 in., 22 ga.  Short bevel or Quincke spinal needle  Needle Placement: Transforaminal  Findings:    -Comments: Excellent flow of contrast along the nerve, nerve root and into the epidural space.  Procedure Details: After squaring off the end-plates to get a true AP view, the C-arm was positioned so that an oblique view of the foramen as noted above was visualized. The target area is just inferior to the "nose of the scotty dog" or sub pedicular. The soft tissues overlying this structure were infiltrated with 2-3 ml. of 1% Lidocaine without Epinephrine.  The spinal needle was inserted toward the target using a "trajectory" view along the fluoroscope beam.  Under AP and lateral visualization, the needle was advanced so it did not puncture dura and was located close the 6 O'Clock position of the pedical in AP tracterory. Biplanar projections were used to confirm position. Aspiration was confirmed to be negative for CSF and/or blood. A 1-2 ml. volume of Isovue-250 was injected and flow of contrast was noted at each level. Radiographs were obtained for documentation purposes.   After attaining the desired flow of contrast documented above, a 0.5 to 1.0 ml test dose of 0.25% Marcaine was injected into each respective transforaminal space.  The patient was observed for 90 seconds post injection.  After no sensory deficits were reported, and normal lower extremity motor function was  noted,   the above injectate was administered so that equal amounts of the injectate were placed at each foramen (level) into the transforaminal epidural space.   Additional Comments:  The patient tolerated the procedure well Dressing: 2 x 2 sterile gauze and Band-Aid    Post-procedure details: Patient was observed during the  procedure. Post-procedure instructions were reviewed.  Patient left the clinic in stable condition.    Clinical History: MRI LUMBAR SPINE WITHOUT AND WITH CONTRAST   TECHNIQUE:  Multiplanar and multiecho pulse sequences of the lumbar spine were  obtained without and with intravenous contrast.   CONTRAST:  18 mL MultiHance IV   COMPARISON:  None.   FINDINGS:  Segmentation:  Normal   Alignment:  5 mm anterolisthesis L4-5.  Mild retrolisthesis L5-S1.   Vertebrae:  Negative for fracture or mass.   Conus medullaris and cauda equina: Conus extends to the L1-2 level.  Conus and cauda equina appear normal.   Paraspinal and other soft tissues: Negative for paraspinous mass or  adenopathy. Multiple bilateral renal cysts.   Disc levels:   L1-2: Negative   L2-3: Mild facet degeneration. Normal disc space. Negative for  stenosis   L3-4: Normal disc space. Mild facet degeneration. Negative for  stenosis   L4-5: 5 mm anterolisthesis. No disc protrusion. Moderate to advanced  facet hypertrophy bilaterally with no significant spinal or  foraminal stenosis   L5-S1: Bilateral rate cage fusion. Posterior laminectomy. Negative  for spinal or foraminal stenosis.   IMPRESSION:  5 mm anterolisthesis L4-5 with moderate to advanced facet  degeneration. No significant stenosis.   Bilateral rate cage fusion L5-S1.  Negative for stenosis.    Electronically Signed    By: Marlan Palau M.D.    On: 06/14/2020 15:25     Objective:  VS:  HT:    WT:   BMI:     BP:122/85  HR:72bpm  TEMP: ( )  RESP:  Physical Exam Vitals and nursing note reviewed.  Constitutional:      General: She is not in acute distress.    Appearance: Normal appearance. She is not ill-appearing.  HENT:     Head: Normocephalic and atraumatic.     Right Ear: External ear normal.     Left Ear: External ear normal.  Eyes:     Extraocular Movements: Extraocular movements intact.  Cardiovascular:     Rate and  Rhythm: Normal rate.     Pulses: Normal pulses.  Pulmonary:     Effort: Pulmonary effort is normal. No respiratory distress.  Abdominal:     General: There is no distension.     Palpations: Abdomen is soft.  Musculoskeletal:        General: Tenderness present.     Cervical back: Neck supple.     Right lower leg: No edema.     Left lower leg: No edema.     Comments: Patient has good distal strength with no pain over the greater trochanters.  No clonus or focal weakness.  Skin:    Findings: No erythema, lesion or rash.  Neurological:     General: No focal deficit present.     Mental Status: She is alert and oriented to person, place, and time.     Sensory: No sensory deficit.     Motor: No weakness or abnormal muscle tone.     Coordination: Coordination normal.  Psychiatric:        Mood and Affect: Mood normal.  Behavior: Behavior normal.     Imaging: No results found.

## 2021-03-02 NOTE — Procedures (Signed)
Lumbosacral Transforaminal Epidural Steroid Injection - Sub-Pedicular Approach with Fluoroscopic Guidance  Patient: Meghan Gallegos      Date of Birth: 04/11/1946 MRN: [id number] PCP: Nicolasa Ducking, MD      Visit Date: 02/24/2021   Universal Protocol:    Date/Time: 02/24/2021  Consent Given By: the patient  Position: PRONE  Additional Comments: Vital signs were monitored before and after the procedure. Patient was prepped and draped in the usual sterile fashion. The correct patient, procedure, and site was verified.   Injection Procedure Details:   Procedure diagnoses: Lumbar radiculopathy [M54.16]    Meds Administered:  Meds ordered this encounter  Medications   methylPREDNISolone acetate (DEPO-MEDROL) injection 80 mg    Laterality: Left  Location/Site:  L4-L5  Needle:5.0 in., 22 ga.  Short bevel or Quincke spinal needle  Needle Placement: Transforaminal  Findings:    -Comments: Excellent flow of contrast along the nerve, nerve root and into the epidural space.  Procedure Details: After squaring off the end-plates to get a true AP view, the C-arm was positioned so that an oblique view of the foramen as noted above was visualized. The target area is just inferior to the "nose of the scotty dog" or sub pedicular. The soft tissues overlying this structure were infiltrated with 2-3 ml. of 1% Lidocaine without Epinephrine.  The spinal needle was inserted toward the target using a "trajectory" view along the fluoroscope beam.  Under AP and lateral visualization, the needle was advanced so it did not puncture dura and was located close the 6 O'Clock position of the pedical in AP tracterory. Biplanar projections were used to confirm position. Aspiration was confirmed to be negative for CSF and/or blood. A 1-2 ml. volume of Isovue-250 was injected and flow of contrast was noted at each level. Radiographs were obtained for documentation purposes.   After attaining the desired flow  of contrast documented above, a 0.5 to 1.0 ml test dose of 0.25% Marcaine was injected into each respective transforaminal space.  The patient was observed for 90 seconds post injection.  After no sensory deficits were reported, and normal lower extremity motor function was noted,   the above injectate was administered so that equal amounts of the injectate were placed at each foramen (level) into the transforaminal epidural space.   Additional Comments:  The patient tolerated the procedure well Dressing: 2 x 2 sterile gauze and Band-Aid    Post-procedure details: Patient was observed during the procedure. Post-procedure instructions were reviewed.  Patient left the clinic in stable condition.

## 2021-05-12 ENCOUNTER — Other Ambulatory Visit: Payer: Self-pay | Admitting: Specialist

## 2021-06-04 ENCOUNTER — Telehealth: Payer: Self-pay | Admitting: Specialist

## 2021-06-04 NOTE — Telephone Encounter (Signed)
Patient has had previous injection on 02/24/2021 with Dr. Alvester Morin. She would like to know if she is able to have another referral placed for another injection with Dr. Alvester Morin. Please advise.

## 2021-06-04 NOTE — Telephone Encounter (Signed)
Patient called reporting that she has an appt with Dr. Otelia Sergeant on 06/11/21. She was wanting to see if a referral can be made for steroid inj on her lower back and possibly get it done the same day as appt.

## 2021-06-08 NOTE — Telephone Encounter (Signed)
Pt called back stating no one ever called her back to tell her if she could get another injection @ her 06/11/21 appt. Pt would like a CB please.  [phone]

## 2021-06-08 NOTE — Telephone Encounter (Signed)
Tried to call but line was busy

## 2021-06-09 NOTE — Telephone Encounter (Signed)
I called and advised that she needs to follow up and be evaluated, and if Dr. Otelia Sergeant feels that she would benefit from an injection then he will place the order and Dr. [place] Blas staff will call her once they rec'd the referral and auth from the insurance. She was ok with this.

## 2021-06-11 ENCOUNTER — Other Ambulatory Visit: Payer: Self-pay

## 2021-06-11 ENCOUNTER — Ambulatory Visit: Payer: Medicare HMO | Admitting: Specialist

## 2021-06-11 ENCOUNTER — Encounter: Payer: Self-pay | Admitting: Specialist

## 2021-06-11 VITALS — BP 122/57 | HR 75 | Ht 65.0 in | Wt 201.0 lb

## 2021-06-11 DIAGNOSIS — M4807 Spinal stenosis, lumbosacral region: Secondary | ICD-10-CM | POA: Diagnosis not present

## 2021-06-11 DIAGNOSIS — M4316 Spondylolisthesis, lumbar region: Secondary | ICD-10-CM

## 2021-06-11 DIAGNOSIS — Z981 Arthrodesis status: Secondary | ICD-10-CM

## 2021-06-11 NOTE — Patient Instructions (Signed)
Avoid bending, stooping and avoid lifting weights greater than 10 lbs. Avoid prolong standing and walking. Order for a new walker with wheels. Surgery scheduling secretary Tivis Ringer, will call you in the next week to schedule for surgery.  Surgery recommended is a one level lumbar fusion L4-5 this would be done with rods, screws and cages with local bone graft and allograft (donor bone graft). Take hydrocodone for for pain. Risk of surgery includes risk of infection 1 in 200 patients, bleeding 0% chance you would need a transfusion.   Risk to the nerves is one in 10,000. You will need to use a brace for 3 months and wean from the brace on the 4th month. Expect improved walking and standing tolerance. Expect relief of leg pain but numbness may persist depending on the length and degree of pressure that has been present.

## 2021-06-11 NOTE — Progress Notes (Signed)
   Office Visit Note   Patient: Meghan Gallegos           Date of Birth: 06/20/1946           MRN: [id number] Visit Date: 06/11/2021              Requested by: Nicolasa Ducking, MD [address] [place],  [place] [address] PCP: Nicolasa Ducking, MD   Assessment & Plan: Visit Diagnoses:  1. Spinal stenosis of lumbosacral region   2. Spondylolisthesis, lumbar region   3. History of lumbar fusion     Plan: Avoid bending, stooping and avoid lifting weights greater than 10 lbs. Avoid prolong standing and walking. Order for a new walker with wheels. Surgery scheduling secretary Tivis Ringer, will call you in the next week to schedule for surgery.  Surgery recommended is a one level lumbar fusion L4-5 this would be done with rods, screws and cages with local bone graft and allograft (donor bone graft). Take hydrocodone for for pain. Risk of surgery includes risk of infection 1 in 200 patients, bleeding 0% chance you would need a transfusion.   Risk to the nerves is one in 10,000. You will need to use a brace for 3 months and wean from the brace on the 4th month. Expect improved walking and standing tolerance. Expect relief of leg pain but numbness may persist depending on the length and degree of pressure that has been present.  Follow-Up Instructions: Return in about 4 weeks (around 07/09/2021).   Orders:  No orders of the defined types were placed in this encounter.  No orders of the defined types were placed in this encounter.     Procedures: No procedures performed   Clinical Data: No additional findings.   Subjective: Chief Complaint  Patient presents with  . Lower Back - Follow-up    75 year old female with history of previous ALIF L5-S1 with ray cages and allograft bone graft.   Review of Systems   Objective: Vital Signs: BP (!) 122/57   Pulse 75   Ht 5\' 5"  (1.651 m)   Wt 201 lb (91.2 kg)   BMI 33.45 kg/m   Physical Exam  Ortho Exam  Specialty  Comments:  No specialty comments available.  Imaging: No results found.   PMFS History: There are no problems to display for this patient.  No past medical history on file.  No family history on file.  No past surgical history on file. Social History   Occupational History  . Not on file  Tobacco Use  . Smoking status: Never  . Smokeless tobacco: Never  Vaping Use  . Vaping Use: Not on file  Substance and Sexual Activity  . Alcohol use: Never  . Drug use: Never  . Sexual activity: Not on file

## 2021-06-17 ENCOUNTER — Telehealth: Payer: Self-pay | Admitting: Physical Medicine and Rehabilitation

## 2021-06-17 NOTE — Telephone Encounter (Signed)
Pt called concerning a referral for steroid injection. Please call pt about this matter at [phone].

## 2021-06-17 NOTE — Telephone Encounter (Signed)
Patient called. She would like an appointment with Dr. Alvester Morin. Her call back number is [phone]

## 2021-06-24 ENCOUNTER — Telehealth: Payer: Self-pay | Admitting: Physical Medicine and Rehabilitation

## 2021-06-24 NOTE — Telephone Encounter (Signed)
Called patient to advise that this is still pending with her insurance. She is going to call [employer]. Patient states that she was told it was already authorized.

## 2021-06-24 NOTE — Telephone Encounter (Signed)
Patient called. She would like an appointment with Dr. Alvester Morin. Her call back number is [phone]

## 2021-07-26 ENCOUNTER — Ambulatory Visit: Payer: Self-pay

## 2021-07-26 ENCOUNTER — Ambulatory Visit: Payer: Medicare HMO | Admitting: Physical Medicine and Rehabilitation

## 2021-07-26 ENCOUNTER — Other Ambulatory Visit: Payer: Self-pay

## 2021-07-26 ENCOUNTER — Encounter: Payer: Self-pay | Admitting: Physical Medicine and Rehabilitation

## 2021-07-26 VITALS — BP 144/63 | HR 70

## 2021-07-26 DIAGNOSIS — M5416 Radiculopathy, lumbar region: Secondary | ICD-10-CM

## 2021-07-26 MED ORDER — METHYLPREDNISOLONE ACETATE 80 MG/ML IJ SUSP
80.0000 mg | Freq: Once | INTRAMUSCULAR | Status: AC
  Administered 2021-07-26: 15:00:00 80 mg

## 2021-07-26 NOTE — Progress Notes (Signed)
Pt state lower back pain that travels down her left leg. Pt state walking, standing and laying down makes the pain worse. Pt state she takes pain meds and uses heat to help ease her pain. Pt has hx of inj on 02/24/21 pt helped.  Numeric Pain Rating Scale and Functional Assessment Average Pain 4   In the last MONTH (on 0-10 scale) has pain interfered with the following?  1. General activity like being  able to carry out your everyday physical activities such as walking, climbing stairs, carrying groceries, or moving a chair?  Rating(8)   +Driver, -BT, -Dye Allergies.

## 2021-07-26 NOTE — Patient Instructions (Signed)

## 2021-07-27 NOTE — Progress Notes (Signed)
Meghan Gallegos - 75 y.o. female MRN [id number]  Date of birth: September 02, 1945  Office Visit Note: Visit Date: 07/26/2021 PCP: Nicolasa Ducking, MD Referred by: Nicolasa Ducking, MD  Subjective: Chief Complaint  Patient presents with   Lower Back - Pain   Left Leg - Pain   HPI:  Meghan Gallegos is a 75 y.o. female who comes in today at the request of Dr. Vira Browns for planned Left L4-5 Lumbar Transforaminal epidural steroid injection with fluoroscopic guidance.  The patient has failed conservative care including home exercise, medications, time and activity modification.  This injection will be diagnostic and hopefully therapeutic.  Please see requesting physician notes for further details and justification.  ROS Otherwise per HPI.  Assessment & Plan: Visit Diagnoses:    ICD-10-CM   1. Lumbar radiculopathy  M54.16 XR C-ARM NO REPORT    Epidural Steroid injection    methylPREDNISolone acetate (DEPO-MEDROL) injection 80 mg      Plan: No additional findings.   Meds & Orders:  Meds ordered this encounter  Medications   methylPREDNISolone acetate (DEPO-MEDROL) injection 80 mg    Orders Placed This Encounter  Procedures   XR C-ARM NO REPORT   Epidural Steroid injection    Follow-up: Return for visit to requesting provider as needed.   Procedures: No procedures performed  Lumbosacral Transforaminal Epidural Steroid Injection - Sub-Pedicular Approach with Fluoroscopic Guidance  Patient: Meghan Gallegos      Date of Birth: June 26, 1946 MRN: [id number] PCP: Nicolasa Ducking, MD      Visit Date: 07/26/2021   Universal Protocol:    Date/Time: 07/26/2021  Consent Given By: the patient  Position: PRONE  Additional Comments: Vital signs were monitored before and after the procedure. Patient was prepped and draped in the usual sterile fashion. The correct patient, procedure, and site was verified.   Injection Procedure Details:   Procedure diagnoses: Lumbar radiculopathy [M54.16]     Meds Administered:  Meds ordered this encounter  Medications   methylPREDNISolone acetate (DEPO-MEDROL) injection 80 mg    Laterality: Left  Location/Site: L4  Needle:5.0 in., 22 ga.  Short bevel or Quincke spinal needle  Needle Placement: Transforaminal  Findings:    -Comments: Excellent flow of contrast along the nerve, nerve root and into the epidural space.  Procedure Details: After squaring off the end-plates to get a true AP view, the C-arm was positioned so that an oblique view of the foramen as noted above was visualized. The target area is just inferior to the "nose of the scotty dog" or sub pedicular. The soft tissues overlying this structure were infiltrated with 2-3 ml. of 1% Lidocaine without Epinephrine.  The spinal needle was inserted toward the target using a "trajectory" view along the fluoroscope beam.  Under AP and lateral visualization, the needle was advanced so it did not puncture dura and was located close the 6 O'Clock position of the pedical in AP tracterory. Biplanar projections were used to confirm position. Aspiration was confirmed to be negative for CSF and/or blood. A 1-2 ml. volume of Isovue-250 was injected and flow of contrast was noted at each level. Radiographs were obtained for documentation purposes.   After attaining the desired flow of contrast documented above, a 0.5 to 1.0 ml test dose of 0.25% Marcaine was injected into each respective transforaminal space.  The patient was observed for 90 seconds post injection.  After no sensory deficits were reported, and normal lower extremity motor function was noted,   the above injectate was  administered so that equal amounts of the injectate were placed at each foramen (level) into the transforaminal epidural space.   Additional Comments:  No complications occurred Dressing: 2 x 2 sterile gauze and Band-Aid    Post-procedure details: Patient was observed during the procedure. Post-procedure  instructions were reviewed.  Patient left the clinic in stable condition.     Clinical History: MRI LUMBAR SPINE WITHOUT AND WITH CONTRAST   TECHNIQUE:  Multiplanar and multiecho pulse sequences of the lumbar spine were  obtained without and with intravenous contrast.   CONTRAST:  18 mL MultiHance IV   COMPARISON:  None.   FINDINGS:  Segmentation:  Normal   Alignment:  5 mm anterolisthesis L4-5.  Mild retrolisthesis L5-S1.   Vertebrae:  Negative for fracture or mass.   Conus medullaris and cauda equina: Conus extends to the L1-2 level.  Conus and cauda equina appear normal.   Paraspinal and other soft tissues: Negative for paraspinous mass or  adenopathy. Multiple bilateral renal cysts.   Disc levels:   L1-2: Negative   L2-3: Mild facet degeneration. Normal disc space. Negative for  stenosis   L3-4: Normal disc space. Mild facet degeneration. Negative for  stenosis   L4-5: 5 mm anterolisthesis. No disc protrusion. Moderate to advanced  facet hypertrophy bilaterally with no significant spinal or  foraminal stenosis   L5-S1: Bilateral rate cage fusion. Posterior laminectomy. Negative  for spinal or foraminal stenosis.   IMPRESSION:  5 mm anterolisthesis L4-5 with moderate to advanced facet  degeneration. No significant stenosis.   Bilateral rate cage fusion L5-S1.  Negative for stenosis.    Electronically Signed    By: Marlan Palau M.D.    On: 06/14/2020 15:25     Objective:  VS:  HT:     WT:    BMI:      BP:(!) 144/63   HR:70bpm   TEMP: ( )   RESP:  Physical Exam Vitals and nursing note reviewed.  Constitutional:      General: She is not in acute distress.    Appearance: Normal appearance. She is not ill-appearing.  HENT:     Head: Normocephalic and atraumatic.     Right Ear: External ear normal.     Left Ear: External ear normal.  Eyes:     Extraocular Movements: Extraocular movements intact.  Cardiovascular:     Rate and Rhythm: Normal rate.      Pulses: Normal pulses.  Pulmonary:     Effort: Pulmonary effort is normal. No respiratory distress.  Abdominal:     General: There is no distension.     Palpations: Abdomen is soft.  Musculoskeletal:        General: Tenderness present.     Cervical back: Neck supple.     Right lower leg: No edema.     Left lower leg: No edema.     Comments: Patient has good distal strength with no pain over the greater trochanters.  No clonus or focal weakness.  Skin:    Findings: No erythema, lesion or rash.  Neurological:     General: No focal deficit present.     Mental Status: She is alert and oriented to person, place, and time.     Sensory: No sensory deficit.     Motor: No weakness or abnormal muscle tone.     Coordination: Coordination normal.  Psychiatric:        Mood and Affect: Mood normal.        Behavior:  Behavior normal.     Imaging: XR C-ARM NO REPORT  Result Date: 07/26/2021 Please see Notes tab for imaging impression.

## 2021-07-27 NOTE — Procedures (Signed)
Lumbosacral Transforaminal Epidural Steroid Injection - Sub-Pedicular Approach with Fluoroscopic Guidance  Patient: Meghan Gallegos      Date of Birth: 20-Oct-1945 MRN: [id number] PCP: Nicolasa Ducking, MD      Visit Date: 07/26/2021   Universal Protocol:    Date/Time: 07/26/2021  Consent Given By: the patient  Position: PRONE  Additional Comments: Vital signs were monitored before and after the procedure. Patient was prepped and draped in the usual sterile fashion. The correct patient, procedure, and site was verified.   Injection Procedure Details:   Procedure diagnoses: Lumbar radiculopathy [M54.16]    Meds Administered:  Meds ordered this encounter  Medications   methylPREDNISolone acetate (DEPO-MEDROL) injection 80 mg    Laterality: Left  Location/Site: L4  Needle:5.0 in., 22 ga.  Short bevel or Quincke spinal needle  Needle Placement: Transforaminal  Findings:    -Comments: Excellent flow of contrast along the nerve, nerve root and into the epidural space.  Procedure Details: After squaring off the end-plates to get a true AP view, the C-arm was positioned so that an oblique view of the foramen as noted above was visualized. The target area is just inferior to the "nose of the scotty dog" or sub pedicular. The soft tissues overlying this structure were infiltrated with 2-3 ml. of 1% Lidocaine without Epinephrine.  The spinal needle was inserted toward the target using a "trajectory" view along the fluoroscope beam.  Under AP and lateral visualization, the needle was advanced so it did not puncture dura and was located close the 6 O'Clock position of the pedical in AP tracterory. Biplanar projections were used to confirm position. Aspiration was confirmed to be negative for CSF and/or blood. A 1-2 ml. volume of Isovue-250 was injected and flow of contrast was noted at each level. Radiographs were obtained for documentation purposes.   After attaining the desired flow of  contrast documented above, a 0.5 to 1.0 ml test dose of 0.25% Marcaine was injected into each respective transforaminal space.  The patient was observed for 90 seconds post injection.  After no sensory deficits were reported, and normal lower extremity motor function was noted,   the above injectate was administered so that equal amounts of the injectate were placed at each foramen (level) into the transforaminal epidural space.   Additional Comments:  No complications occurred Dressing: 2 x 2 sterile gauze and Band-Aid    Post-procedure details: Patient was observed during the procedure. Post-procedure instructions were reviewed.  Patient left the clinic in stable condition.

## 2021-09-24 ENCOUNTER — Encounter: Payer: Self-pay | Admitting: Specialist

## 2021-09-24 ENCOUNTER — Other Ambulatory Visit: Payer: Self-pay

## 2021-09-24 ENCOUNTER — Ambulatory Visit (INDEPENDENT_AMBULATORY_CARE_PROVIDER_SITE_OTHER): Payer: Medicare HMO

## 2021-09-24 ENCOUNTER — Ambulatory Visit: Payer: Medicare HMO | Admitting: Specialist

## 2021-09-24 ENCOUNTER — Telehealth: Payer: Self-pay | Admitting: Specialist

## 2021-09-24 VITALS — BP 131/63 | HR 63 | Ht 65.0 in | Wt 201.0 lb

## 2021-09-24 DIAGNOSIS — Z981 Arthrodesis status: Secondary | ICD-10-CM | POA: Diagnosis not present

## 2021-09-24 DIAGNOSIS — M4316 Spondylolisthesis, lumbar region: Secondary | ICD-10-CM | POA: Diagnosis not present

## 2021-09-24 DIAGNOSIS — M5410 Radiculopathy, site unspecified: Secondary | ICD-10-CM | POA: Diagnosis not present

## 2021-09-24 DIAGNOSIS — M4807 Spinal stenosis, lumbosacral region: Secondary | ICD-10-CM | POA: Diagnosis not present

## 2021-09-24 MED ORDER — ALPRAZOLAM 0.25 MG PO TABS: 0.2500 mg | ORAL_TABLET | Freq: Every evening | ORAL | 0 refills | Status: DC | PRN

## 2021-09-24 NOTE — Telephone Encounter (Signed)
Varshal from [employer] called. Says patient is on a lot of pain medication from other doctors. Says it is risky to fill the RX prescribed. Would like clarification. His call back number is [phone]

## 2021-09-24 NOTE — Patient Instructions (Signed)
Plan: Avoid bending, stooping and avoid lifting weights greater than 10 lbs. Avoid prolong standing and walking. Avoid frequent bending and stooping  No lifting greater than 10 lbs. May use ice or moist heat for pain. Weight loss is of benefit. Handicap license is approved. MRI of the lumbar spine ordered and we will see you back the same day. Appt for MRI Thurs or Mon. Call and ask for Christy to have her obtain and appointment with [place] the same day to avoid long trip  Home and back.

## 2021-09-24 NOTE — Progress Notes (Signed)
Office Visit Note   Patient: Meghan Gallegos           Date of Birth: 12/13/1945           MRN: [id number] Visit Date: 09/24/2021              Requested by: Nicolasa Ducking, MD [address] [place],  [place] [address] PCP: Nicolasa Ducking, MD   Assessment & Plan: Visit Diagnoses:  1. Spondylolisthesis, lumbar region   2. Radiculopathy, unspecified spinal region   3. History of lumbar fusion   4. Spinal stenosis of lumbosacral region     Plan: Avoid bending, stooping and avoid lifting weights greater than 10 lbs. Avoid prolong standing and walking. Avoid frequent bending and stooping  No lifting greater than 10 lbs. May use ice or moist heat for pain. Weight loss is of benefit. Handicap license is approved. MRI of the lumbar spine ordered and we will see you back the same day. Appt for MRI Thurs or Mon. Call and ask for Christy to have her obtain and appointment with [place] the same day to avoid long trip  Home and back.  Follow-Up Instructions: No follow-ups on file.   Orders:  Orders Placed This Encounter  Procedures   XR Lumbar Spine 2-3 Views   No orders of the defined types were placed in this encounter.     Procedures: No procedures performed   Clinical Data: No additional findings.   Subjective: Chief Complaint  Patient presents with   Lower Back - Pain    76 year old female with back apin and radiates into both legs, the back pain is greater than in the legs. Feels like the back is sliding out of position. She had the pains all the time and it gets really bad by about 1/2 block. With numbness and tingling into the left foot. First 2 ESIs helped but the last one was no benefit. She is having to double up on her hydrocodone. Pain increases in the late morning into the early afternoon pain increases. No bowel or bladder difficulties. She feels better leaning on a grocery cart. Pain is better sitting more than walking but she is not able to sit long either.  Riding in a car is painful over the backs of the distal thigh where it hits the seat. Has been taking hydrocodone, now taking 10 mg every 4-5 hours and takes morphine every 4-5 hours. Pain management in the [place]  and [employer].    Review of Systems  Constitutional: Negative.   HENT: Negative.    Eyes: Negative.   Respiratory: Negative.    Cardiovascular: Negative.   Gastrointestinal: Negative.   Endocrine: Negative.   Genitourinary: Negative.   Musculoskeletal: Negative.   Skin: Negative.   Allergic/Immunologic: Negative.   Neurological: Negative.   Hematological: Negative.   Psychiatric/Behavioral: Negative.      Objective: Vital Signs: BP 131/63    Pulse 63    Ht 5\' 5"  (1.651 m)    Wt 201 lb (91.2 kg)    BMI 33.45 kg/m   Physical Exam Constitutional:      Appearance: She is well-developed.  HENT:     Head: Normocephalic and atraumatic.  Eyes:     Pupils: Pupils are equal, round, and reactive to light.  Pulmonary:     Effort: Pulmonary effort is normal.     Breath sounds: Normal breath sounds.  Abdominal:     General: Bowel sounds are normal.  Palpations: Abdomen is soft.  Musculoskeletal:        General: Normal range of motion.     Cervical back: Normal range of motion and neck supple.  Skin:    General: Skin is warm and dry.  Neurological:     Mental Status: She is alert and oriented to person, place, and time.  Psychiatric:        Behavior: Behavior normal.        Thought Content: Thought content normal.        Judgment: Judgment normal.   Back Exam   Tenderness  The patient is experiencing tenderness in the lumbar.    Specialty Comments:  No specialty comments available.  Imaging: No results found.   PMFS History: There are no problems to display for this patient.  History reviewed. No pertinent past medical history.  History reviewed. No pertinent family history.  History reviewed. No pertinent surgical history. Social History    Occupational History   Not on file  Tobacco Use   Smoking status: Never   Smokeless tobacco: Never  Vaping Use   Vaping Use: Not on file  Substance and Sexual Activity   Alcohol use: Never   Drug use: Never   Sexual activity: Not on file

## 2021-09-27 NOTE — Telephone Encounter (Signed)
Xanax rx was not a normal written rx, Per Dr. Otelia Sergeant cancel the rx, I called the pharmacy and cancelled it at his request. She does take, MS Cotin, and Hydrocodone

## 2021-09-27 NOTE — Addendum Note (Signed)
Addended by: Vira Browns on: 09/27/2021 06:29 PM   Modules accepted: Orders

## 2021-09-29 ENCOUNTER — Telehealth: Payer: Self-pay | Admitting: Specialist

## 2021-09-29 NOTE — Telephone Encounter (Signed)
I called and advised patient that she needs to r/s this for a Monday or Thursday morning as those are the afternoons that he is in the office, she will call and r/s and call me back

## 2021-09-29 NOTE — Telephone Encounter (Signed)
Patient called advised Dr. Otelia Sergeant told her to come over to the office to see him right after she have the MRI which is scheduled for 10/22/2021 at 11:00am   The number to contact patient is [phone]

## 2021-10-18 ENCOUNTER — Ambulatory Visit
Admission: RE | Admit: 2021-10-18 | Discharge: 2021-10-18 | Disposition: A | Discharge status: Home / Self Care | Payer: Medicare HMO | Source: Ambulatory Visit | Attending: Specialist | Admitting: Specialist

## 2021-10-18 ENCOUNTER — Ambulatory Visit: Payer: Medicare HMO | Admitting: Specialist

## 2021-10-18 ENCOUNTER — Other Ambulatory Visit: Payer: Self-pay

## 2021-10-18 ENCOUNTER — Encounter: Payer: Self-pay | Admitting: Specialist

## 2021-10-18 VITALS — BP 141/76 | HR 72 | Ht 65.0 in | Wt 201.0 lb

## 2021-10-18 DIAGNOSIS — M5136 Other intervertebral disc degeneration, lumbar region: Secondary | ICD-10-CM | POA: Diagnosis not present

## 2021-10-18 DIAGNOSIS — M5416 Radiculopathy, lumbar region: Secondary | ICD-10-CM | POA: Diagnosis not present

## 2021-10-18 DIAGNOSIS — M4316 Spondylolisthesis, lumbar region: Secondary | ICD-10-CM

## 2021-10-18 DIAGNOSIS — Z981 Arthrodesis status: Secondary | ICD-10-CM | POA: Diagnosis not present

## 2021-10-18 DIAGNOSIS — M4807 Spinal stenosis, lumbosacral region: Secondary | ICD-10-CM

## 2021-10-18 DIAGNOSIS — M5410 Radiculopathy, site unspecified: Secondary | ICD-10-CM

## 2021-10-18 MED ORDER — GADOBENATE DIMEGLUMINE 529 MG/ML IV SOLN
19.0000 mL | Freq: Once | INTRAVENOUS | Status: AC | PRN
  Administered 2021-10-18: 19 mL via INTRAVENOUS

## 2021-10-18 NOTE — Patient Instructions (Signed)
Avoid bending, stooping and avoid lifting weights greater than 10 lbs. ?Avoid prolong standing and walking. ?Order for a new walker with wheels. ?Surgery scheduling secretary Tivis Ringer, will call you in the next week to schedule for surgery.  ?Surgery recommended is a one level lumbar fusion L4-5 this would be done with rods, screws and cages with local bone graft and allograft cancellous chips and BMP. ?Risk of surgery includes risk of infection 1 in 200 patients, bleeding 1/2% chance you would need a transfusion.   Risk to the nerves is one in 10,000. ?You will need to use a brace for 3 months and wean from the brace on the 4th month. ?Expect improved walking and standing tolerance. Expect relief of leg pain but numbness ?may persist depending on the length and degree of pressure that has been present. ?  ?

## 2021-10-18 NOTE — Progress Notes (Signed)
Office Visit Note   Patient: Meghan Gallegos           Date of Birth: 10/21/1945           MRN: [id number] Visit Date: 10/18/2021              Requested by: Chriss Czar, MD [id number] [place],  [id number] PCP: Chriss Czar, MD   Assessment & Plan: Visit Diagnoses:  1. Spondylolisthesis, lumbar region   2. History of lumbar fusion   3. Lumbar radiculopathy     Plan: Avoid bending, stooping and avoid lifting weights greater than 10 lbs. Avoid prolong standing and walking. Order for a new walker with wheels. Surgery scheduling secretary Kandice Hams, will call you in the next week to schedule for surgery.  Surgery recommended is a one level lumbar fusion L4-5 this would be done with rods, screws and cages with local bone graft and allograft cancellous chips and BMP. Risk of surgery includes risk of infection 1 in 200 patients, bleeding 1/2% chance you would need a transfusion.   Risk to the nerves is one in 10,000. You will need to use a brace for 3 months and wean from the brace on the 4th month. Expect improved walking and standing tolerance. Expect relief of leg pain but numbness may persist depending on the length and degree of pressure that has been present.  Follow-Up Instructions: Return in about 4 weeks (around 11/15/2021).   Orders:  No orders of the defined types were placed in this encounter.  No orders of the defined types were placed in this encounter.     Procedures: No procedures performed   Clinical Data: Findings:  Narrative & Impression CLINICAL DATA:  Lumbar radiculopathy low back pain   EXAM: MRI LUMBAR SPINE WITHOUT AND WITH CONTRAST   TECHNIQUE: Multiplanar and multiecho pulse sequences of the lumbar spine were obtained without and with intravenous contrast.   CONTRAST:  47mL MULTIHANCE GADOBENATE DIMEGLUMINE 529 MG/ML IV SOLN   COMPARISON:  Radiograph dated September 24, 2021   FINDINGS: Segmentation:  Standard.    Alignment: Grade 1 anterolisthesis of L4 and mild retrolisthesis of S1, unchanged.   Vertebrae:  No fracture, evidence of discitis, or bone lesion.   Conus medullaris and cauda equina: Conus extends to the L1-L2 level. Conus and cauda equina appear normal.   Paraspinal and other soft tissues: Multiple bilateral renal cysts. Paraspinal soft tissues are unremarkable.   Disc spaces:   T12-L1: No significant disc bulge. No neural foraminal stenosis. No central canal stenosis.   L1-L2: No significant disc bulge. No neural foraminal stenosis. No central canal stenosis.   L2-L3: No significant disc bulge. No neural foraminal stenosis. No central canal stenosis. Bilateral facet joint arthropathy.   L3-L4: No significant disc bulge. No neural foraminal stenosis. No central canal stenosis. Ligamentum flavum hypertrophy and mild facet joint arthropathy.   L4-L5: 5 mm anterolisthesis of L4, unchanged. Marked facet joint arthropathy. Ligamentum flavum hypertrophy with mild narrowing of spinal canal. No neural foraminal narrowing.   L5-S1: Postsurgical changes with interbody cage fusion device in place. Laminectomy changes. No spinal canal or neural foraminal narrowing.   IMPRESSION: 1. Grade 1 anterolisthesis of L4-L5 with ligamentum flavum hypertrophy and mild narrowing of spinal canal. No significant neural foraminal narrowing.   2. Postsurgical changes with intact hardware. No significant spinal canal or neural foraminal narrowing at L5-S1.   3. No significant changes since prior MRI examination. No abnormal enhancement.  Electronically Signed   By: Keane Police D.O.   On: 10/18/2021 14:52     Subjective: Chief Complaint  Patient presents with   Lower Back - Follow-up    MRI Review    HPI  Review of Systems   Objective: Vital Signs: BP (!) 141/76 (BP Location: Left Arm, Patient Position: Sitting)    Pulse 72    Ht 5\' 5"  (1.651 m)    Wt 201 lb (91.2 kg)     BMI 33.45 kg/m   Physical Exam  Ortho Exam  Specialty Comments:  No specialty comments available.  Imaging: MR Lumbar Spine W Wo Contrast  Result Date: 10/18/2021 CLINICAL DATA:  Lumbar radiculopathy low back pain EXAM: MRI LUMBAR SPINE WITHOUT AND WITH CONTRAST TECHNIQUE: Multiplanar and multiecho pulse sequences of the lumbar spine were obtained without and with intravenous contrast. CONTRAST:  48mL MULTIHANCE GADOBENATE DIMEGLUMINE 529 MG/ML IV SOLN COMPARISON:  Radiograph dated September 24, 2021 FINDINGS: Segmentation:  Standard. Alignment: Grade 1 anterolisthesis of L4 and mild retrolisthesis of S1, unchanged. Vertebrae:  No fracture, evidence of discitis, or bone lesion. Conus medullaris and cauda equina: Conus extends to the L1-L2 level. Conus and cauda equina appear normal. Paraspinal and other soft tissues: Multiple bilateral renal cysts. Paraspinal soft tissues are unremarkable. Disc spaces: T12-L1: No significant disc bulge. No neural foraminal stenosis. No central canal stenosis. L1-L2: No significant disc bulge. No neural foraminal stenosis. No central canal stenosis. L2-L3: No significant disc bulge. No neural foraminal stenosis. No central canal stenosis. Bilateral facet joint arthropathy. L3-L4: No significant disc bulge. No neural foraminal stenosis. No central canal stenosis. Ligamentum flavum hypertrophy and mild facet joint arthropathy. L4-L5: 5 mm anterolisthesis of L4, unchanged. Marked facet joint arthropathy. Ligamentum flavum hypertrophy with mild narrowing of spinal canal. No neural foraminal narrowing. L5-S1: Postsurgical changes with interbody cage fusion device in place. Laminectomy changes. No spinal canal or neural foraminal narrowing. IMPRESSION: 1. Grade 1 anterolisthesis of L4-L5 with ligamentum flavum hypertrophy and mild narrowing of spinal canal. No significant neural foraminal narrowing. 2. Postsurgical changes with intact hardware. No significant spinal canal or  neural foraminal narrowing at L5-S1. 3. No significant changes since prior MRI examination. No abnormal enhancement. Electronically Signed   By: Keane Police D.O.   On: 10/18/2021 14:52     PMFS History: There are no problems to display for this patient.  History reviewed. No pertinent past medical history.  History reviewed. No pertinent family history.  History reviewed. No pertinent surgical history. Social History   Occupational History   Not on file  Tobacco Use   Smoking status: Never   Smokeless tobacco: Never  Vaping Use   Vaping Use: Not on file  Substance and Sexual Activity   Alcohol use: Never   Drug use: Never   Sexual activity: Not on file

## 2021-11-10 ENCOUNTER — Telehealth: Payer: Self-pay | Admitting: Specialist

## 2021-11-10 NOTE — Telephone Encounter (Signed)
I called and spoke with patient, she states that her PCP found an issue with her Thyroid (Hyperthyroidism) and he would like her to get it under control for surgery, but she is wanting to know what Dr. Louanne Skye thinks about this. Can you please advise.  ? ?

## 2021-11-10 NOTE — Telephone Encounter (Signed)
Pt called requesting a call back from [place]. Pt did not leave a reason for call back. Pt phone number is [phone]. ?

## 2021-11-15 NOTE — Telephone Encounter (Signed)
I called and advised that she should get this under control prior to having surgery, she states that she understands. ?

## 2021-11-25 ENCOUNTER — Telehealth: Payer: Self-pay | Admitting: Specialist

## 2021-11-25 NOTE — Telephone Encounter (Signed)
Patient called asked if Dr. Otelia Sergeant will approve her getting a steroid injection from Dr. Alvester Morin? The number to contact patient is [phone] ?

## 2021-11-29 NOTE — Telephone Encounter (Signed)
Waiting for Dr. Nitka to return 

## 2021-12-02 ENCOUNTER — Other Ambulatory Visit: Payer: Self-pay | Admitting: Specialist

## 2021-12-02 DIAGNOSIS — M47816 Spondylosis without myelopathy or radiculopathy, lumbar region: Secondary | ICD-10-CM

## 2021-12-02 NOTE — Telephone Encounter (Signed)
I called and advised that the order was placed for an injection and that they should call her in the next few days ?

## 2021-12-02 NOTE — Telephone Encounter (Signed)
Printed for Dr. Nitka ? ?

## 2021-12-07 ENCOUNTER — Encounter: Payer: Self-pay | Admitting: Specialist

## 2021-12-15 ENCOUNTER — Encounter: Payer: Self-pay | Admitting: Specialist

## 2021-12-21 ENCOUNTER — Other Ambulatory Visit: Payer: Self-pay

## 2021-12-23 NOTE — Progress Notes (Addendum)
Surgical Instructions    Your procedure is scheduled on Monday, 12/27/21.  Report to [facility] Main Entrance "A" at 5:30 A.M., then check in with the Admitting office.  Call this number if you have problems the morning of surgery:  [phone]   If you have any questions prior to your surgery date call [phone]: Open Monday-Friday 8am-4pm    Remember:  Do not eat after midnight the night before your surgery  You may drink clear liquids until 4:30am the morning of your surgery.   Clear liquids allowed are: Water, Non-Citrus Juices (without pulp), Carbonated Beverages, Clear Tea, Black Coffee ONLY (NO MILK, CREAM OR POWDERED CREAMER of any kind), and Gatorade    Take these medicines the morning of surgery with A SIP OF WATER:  cetirizine (ZYRTEC) gabapentin (NEURONTIN) morphine (MS CONTIN) pantoprazole (PROTONIX)  HYDROcodone-acetaminophen (NORCO)  IF NEEDED:   As of today, STOP taking any Aspirin (unless otherwise instructed by your surgeon) Aleve, Naproxen, Ibuprofen, Motrin, Advil, Goody's, BC's, all herbal medications, fish oil, diclofenac Sodium (VOLTAREN) gel, meloxicam (MOBIC) and all vitamins.           Do not wear jewelry or makeup Do not wear lotions, powders, perfumes/colognes, or deodorant. Do not shave 48 hours prior to surgery.   Do not bring valuables to the hospital. Do not wear nail polish, gel polish, artificial nails, or any other type of covering on natural nails (fingers and toes) If you have artificial nails or gel coating that need to be removed by a nail salon, please have this removed prior to surgery. Artificial nails or gel coating may interfere with anesthesia's ability to adequately monitor your vital signs.  [facility] is not responsible for any belongings or valuables. .   Do NOT Smoke (Tobacco/Vaping)  24 hours prior to your procedure  If you use a CPAP at night, you may bring your mask for your overnight stay.   Contacts, glasses,  hearing aids, dentures or partials may not be worn into surgery, please bring cases for these belongings   For patients admitted to the hospital, discharge time will be determined by your treatment team.   Patients discharged the day of surgery will not be allowed to drive home, and someone needs to stay with them for 24 hours.   SURGICAL WAITING ROOM VISITATION Patients having surgery or a procedure in a hospital may have two support people. Children under the age of 31 must have an adult with them who is not the patient. They may stay in the waiting area during the procedure and may switch out with other visitors. If the patient needs to stay at the hospital during part of their recovery, the visitor guidelines for inpatient rooms apply.  Please refer to the [facility] website for the visitor guidelines for Inpatients (after your surgery is over and you are in a regular room).       Special instructions:    Oral Hygiene is also important to reduce your risk of infection.  Remember - BRUSH YOUR TEETH THE MORNING OF SURGERY WITH YOUR REGULAR TOOTHPASTE   [place]- Preparing For Surgery  Before surgery, you can play an important role. Because skin is not sterile, your skin needs to be as free of germs as possible. You can reduce the number of germs on your skin by washing with CHG (chlorahexidine gluconate) Soap before surgery.  CHG is an antiseptic cleaner which kills germs and bonds with the skin to continue killing germs even after  washing.     Please do not use if you have an allergy to CHG or antibacterial soaps. If your skin becomes reddened/irritated stop using the CHG.  Do not shave (including legs and underarms) for at least 48 hours prior to first CHG shower. It is OK to shave your face.  Please follow these instructions carefully.     Shower the NIGHT BEFORE SURGERY and the MORNING OF SURGERY with CHG Soap.   If you chose to wash your hair, wash your hair first as  usual with your normal shampoo. After you shampoo, rinse your hair and body thoroughly to remove the shampoo.  Then [employer] and genitals (private parts) with your normal soap and rinse thoroughly to remove soap.  After that Use CHG Soap as you would any other liquid soap. You can apply CHG directly to the skin and wash gently with a scrungie or a clean washcloth.   Apply the CHG Soap to your body ONLY FROM THE NECK DOWN.  Do not use on open wounds or open sores. Avoid contact with your eyes, ears, mouth and genitals (private parts). Wash Face and genitals (private parts)  with your normal soap.   Wash thoroughly, paying special attention to the area where your surgery will be performed.  Thoroughly rinse your body with warm water from the neck down.  DO NOT shower/wash with your normal soap after using and rinsing off the CHG Soap.  Pat yourself dry with a CLEAN TOWEL.  Wear CLEAN PAJAMAS to bed the night before surgery  Place CLEAN SHEETS on your bed the night before your surgery  DO NOT SLEEP WITH PETS.   Day of Surgery: Take a shower with CHG soap. Wear Clean/Comfortable clothing the morning of surgery Do not apply any deodorants/lotions.   Remember to brush your teeth WITH YOUR REGULAR TOOTHPASTE.    If you received a COVID test during your pre-op visit, it is requested that you wear a mask when out in public, stay away from anyone that may not be feeling well, and notify your surgeon if you develop symptoms. If you have been in contact with anyone that has tested positive in the last 10 days, please notify your surgeon.    Please read over the following fact sheets that you were given.

## 2021-12-24 ENCOUNTER — Encounter: Payer: Self-pay | Admitting: Specialist

## 2021-12-24 ENCOUNTER — Encounter (HOSPITAL_COMMUNITY): Payer: Self-pay

## 2021-12-24 ENCOUNTER — Ambulatory Visit (INDEPENDENT_AMBULATORY_CARE_PROVIDER_SITE_OTHER): Payer: Medicare HMO | Admitting: Specialist

## 2021-12-24 ENCOUNTER — Encounter (HOSPITAL_COMMUNITY)
Admission: RE | Admit: 2021-12-24 | Discharge: 2021-12-24 | Disposition: A | Discharge status: Home / Self Care | Payer: Medicare HMO | Source: Ambulatory Visit | Attending: Specialist | Admitting: Specialist

## 2021-12-24 ENCOUNTER — Other Ambulatory Visit: Payer: Self-pay

## 2021-12-24 VITALS — BP 121/65 | HR 77 | Ht 64.5 in | Wt 203.0 lb

## 2021-12-24 VITALS — BP 151/86 | HR 80 | Temp 98.6°F | Resp 18 | Ht 65.0 in | Wt 208.1 lb

## 2021-12-24 DIAGNOSIS — E0590 Thyrotoxicosis, unspecified without thyrotoxic crisis or storm: Secondary | ICD-10-CM | POA: Diagnosis not present

## 2021-12-24 DIAGNOSIS — Z01818 Encounter for other preprocedural examination: Secondary | ICD-10-CM

## 2021-12-24 DIAGNOSIS — M47816 Spondylosis without myelopathy or radiculopathy, lumbar region: Secondary | ICD-10-CM

## 2021-12-24 DIAGNOSIS — Z6834 Body mass index (BMI) 34.0-34.9, adult: Secondary | ICD-10-CM | POA: Diagnosis not present

## 2021-12-24 DIAGNOSIS — M4316 Spondylolisthesis, lumbar region: Secondary | ICD-10-CM

## 2021-12-24 DIAGNOSIS — M5137 Other intervertebral disc degeneration, lumbosacral region: Secondary | ICD-10-CM | POA: Insufficient documentation

## 2021-12-24 DIAGNOSIS — M4807 Spinal stenosis, lumbosacral region: Secondary | ICD-10-CM

## 2021-12-24 DIAGNOSIS — Z981 Arthrodesis status: Secondary | ICD-10-CM | POA: Insufficient documentation

## 2021-12-24 DIAGNOSIS — R011 Cardiac murmur, unspecified: Secondary | ICD-10-CM | POA: Diagnosis not present

## 2021-12-24 DIAGNOSIS — I10 Essential (primary) hypertension: Secondary | ICD-10-CM | POA: Insufficient documentation

## 2021-12-24 DIAGNOSIS — M5136 Other intervertebral disc degeneration, lumbar region: Secondary | ICD-10-CM | POA: Diagnosis not present

## 2021-12-24 DIAGNOSIS — E669 Obesity, unspecified: Secondary | ICD-10-CM | POA: Insufficient documentation

## 2021-12-24 DIAGNOSIS — M4317 Spondylolisthesis, lumbosacral region: Secondary | ICD-10-CM | POA: Diagnosis not present

## 2021-12-24 DIAGNOSIS — M5416 Radiculopathy, lumbar region: Secondary | ICD-10-CM

## 2021-12-24 DIAGNOSIS — M51369 Other intervertebral disc degeneration, lumbar region without mention of lumbar back pain or lower extremity pain: Secondary | ICD-10-CM

## 2021-12-24 DIAGNOSIS — M5410 Radiculopathy, site unspecified: Secondary | ICD-10-CM

## 2021-12-24 HISTORY — DX: Essential (primary) hypertension: I10

## 2021-12-24 HISTORY — DX: Pneumonia, unspecified organism: J18.9

## 2021-12-24 LAB — BASIC METABOLIC PANEL
Anion gap: 6 (ref 5–15)
BUN: 11 mg/dL (ref 8–23)
CO2: 34 mmol/L — ABNORMAL HIGH (ref 22–32)
Calcium: 9.2 mg/dL (ref 8.9–10.3)
Chloride: 99 mmol/L (ref 98–111)
Creatinine, Ser: 0.64 mg/dL (ref 0.44–1.00)
GFR, Estimated: >60 mL/min (ref >60)
Glucose, Bld: 107 mg/dL — ABNORMAL HIGH (ref 70–99)
Potassium: 4.1 mmol/L (ref 3.5–5.1)
Sodium: 139 mmol/L (ref 135–145)

## 2021-12-24 LAB — CBC
HCT: 40.3 % (ref 36.0–46.0)
Hemoglobin: 12.5 g/dL (ref 12.0–15.0)
MCH: 26.5 pg (ref 26.0–34.0)
MCHC: 31 g/dL (ref 30.0–36.0)
MCV: 85.4 fL (ref 80.0–100.0)
Platelets: 207 10*3/uL (ref 150–400)
RBC: 4.72 MIL/uL (ref 3.87–5.11)
RDW: 12.7 % (ref 11.5–15.5)
WBC: 5 10*3/uL (ref 4.0–10.5)
nRBC: 0 % (ref 0.0–0.2)

## 2021-12-24 LAB — SURGICAL PCR SCREEN
MRSA, PCR: NEGATIVE
Staphylococcus aureus: NEGATIVE

## 2021-12-24 LAB — NO BLOOD PRODUCTS

## 2021-12-24 NOTE — Progress Notes (Signed)
PCP - Meghan Gallegos Cardiologist - Meghan Gallegos at [facility]  PPM/ICD - denies   Chest x-ray - n/a EKG - 12/24/21 Stress Test - done in [facility] [place]- records requested ECHO - recommended by cardiologist but patient refused Cardiac Cath - denies  Sleep Study - denies   As of today, STOP taking any Aspirin (unless otherwise instructed by your surgeon) Aleve, Naproxen, Ibuprofen, Motrin, Advil, Goody's, BC's, all herbal medications, fish oil, diclofenac Sodium (VOLTAREN) gel, meloxicam (MOBIC) and all vitamins.  ERAS Protcol -yes PRE-SURGERY Ensure or G2- ensure give  COVID TEST- not needed   Anesthesia review: yes, records requested from [facility] [place] hospital for stress test results and all labs/further testing. Blood products refusal   Patient denies shortness of breath, fever, cough and chest pain at PAT appointment   All instructions explained to the patient, with a verbal understanding of the material. Patient agrees to go over the instructions while at home for a better understanding. Patient also instructed to self quarantine after being tested for COVID-19. The opportunity to ask questions was provided.

## 2021-12-24 NOTE — Progress Notes (Signed)
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 BMI is consistent with obesity.   She was evaluated by PCP Audie Box, MD on 11/04/21 for follow-up of chronic medical conditions and for preoperative evaluation. She is followed at a [facility] in [place]. HTN overall controlled. Noted that previously another provider had heard a right sided murmur in 2022 which was not heard at 01/2021 cardiology visit or on 11/04/21 PCP visit. He noted patient had declined echo. A1c 5.7% (down from 6.1%). Thyroid panel for subclinical hyperthyroidism showed low TSH at < 0.01 (0.45-4.94) and down fro m0.04 on 03/01/21, high T3 Total at 206.40 (64.00-152.00), normal Free T4 1.01 (0.70-1.15), and subsequently wrote, "US showed heterogenous non-enlarged thyroid with multiple small, benign nodules. Pt is asymptomatic so will defer treatment at this time and approve for back surgery. Will monitor sx and treat with methimazole as needed. Defer Endo referral at this time." Dr. Robb Matar signed a medical clearance form for surgery. BP 151/86, HR 80. She is on losartan-HCTZ for HTN and a statin, no b-blocker.  As above, she had a cardiology evaluation on 01/07/21 by Dr. Winn Jock after referral by Imogene Burn, NP for evaluation of a heart murmur. She noted that patient "does not have any symptoms that concern her  about her heart."  She as a long history of occasional sharp stabbing chest pains associated with stressful situations but had not changed in years. Her activity at the time was limited due to a recent back injury. Occasional dizzy spells for years, but no worse or associated with activity. Only smoked for about 1 year in her twenties. No alcohol use. Her father died of either an MI or arrhythmia in the setting of CHF at age 38. Her brother died at age 65 within a year of undergoing a TAVR and had underlying HF. Dr. Winn Jock did not auscultate a murmur or abnormal heart sounds. No edema. Lungs clear; However, she added, "Given her brother's valvular disease and previously noted murmur with her PCP, it is reasonable to assess for structural heart disease." Patient never scheduled the echo.   She refuses blood products. CBC normal. She is on MS Continue 15 mg TID.   Medically cleared for surgery. PCP aware she did not have the echo which was ordered more because of her family history of valvular disease--no murmur heard by cardiologist and was not having any acute CV/HF symptoms. PCP is also monitoring her subclinical hyperthyroidism and seems to be reserving treatment and endocrinology referral to as indicated should she become symptomatic.   Discussed with anesthesiologist Hoy Morn, MD. Anesthesia team to evaluate on the day of surgery.   VS: BP (!) 151/86   Pulse 80   Temp 37 C (Oral)   Resp 18   Ht 5\' 5"  (1.651 m)   Wt 94.4 kg   SpO2 95%   BMI 34.63 kg/m    PROVIDERS: Audie Box,  MD is PCP ([facility]) - Marzetta Board, MD is cardiologist [facility]) - She previously was followed by endocrinologist Idelia Salm, DO with [facility], but currently thyroid studies are done per her PCP. She was seen for likely thyroiditis with improvement in thyroid levels. No specific treatment recommended at that time other then continued monitoring.   LABS: Labs reviewed:  Acceptable for surgery. (all labs ordered are listed, but only abnormal results are displayed)  Labs Reviewed  BASIC METABOLIC PANEL - Abnormal; Notable for the following components:      Result Value   CO2 34 (*)    Glucose, Bld 107 (*)    All other components within normal limits  SURGICAL PCR SCREEN  CBC  NO BLOOD PRODUCTS     IMAGES: MRI L-spine 10/18/21: IMPRESSION: 1. Grade 1 anterolisthesis of L4-L5 with ligamentum flavum hypertrophy and mild narrowing of spinal canal. No significant neural foraminal narrowing. 2. Postsurgical changes with intact hardware. No significant spinal canal or neural foraminal narrowing at L5-S1. 3. No significant changes since prior MRI examination. No abnormal enhancement.    EKG: 12/24/21: Normal sinus rhythm Left axis deviation Abnormal ECG No previous ECGs available   CV: She had a stress test in [place] > 5 years ago.   Past Medical History:  Diagnosis Date   Hypertension    Pneumonia     Past Surgical History:  Procedure Laterality Date   BACK SURGERY     CERVICAL FUSION     2011   LAMINECTOMY     1999   LUMBAR FUSION  1999    MEDICATIONS:  APPLE CIDER VINEGAR PO   aspirin 325 MG tablet   atorvastatin (LIPITOR) 20 MG tablet   baclofen (LIORESAL) 10 MG tablet   BIOTIN PO   cetirizine (ZYRTEC) 10 MG tablet   Cholecalciferol (VITAMIN D-3) 125 MCG (5000 UT) TABS   ciclopirox (PENLAC) 8 % solution   DULoxetine (CYMBALTA) 60 MG capsule   gabapentin (NEURONTIN) 300 MG capsule   Green Tea, Camellia sinensis, (GREEN TEA PO)   HYDROcodone-acetaminophen (NORCO) 10-325 MG tablet   losartan-hydrochlorothiazide (HYZAAR) 50-12.5 MG tablet   meloxicam (MOBIC) 15 MG tablet   morphine (MS CONTIN) 15 MG 12 hr tablet   Multiple Vitamins-Minerals (ADULT ONE DAILY GUMMIES PO)   pantoprazole (PROTONIX) 40 MG tablet   Potassium 99 MG TABS   senna (SENOKOT) 8.6 MG tablet   traZODone (DESYREL) 100 MG tablet   No current  facility-administered medications for this encounter.   ASA on hold for surgery.   Myra Gianotti, PA-C Surgical Short Stay/Anesthesiology [facility] Phone [phone] [facility] Phone [phone] 12/24/2021 12:45 PM

## 2021-12-24 NOTE — Anesthesia Preprocedure Evaluation (Addendum)
Anesthesia Evaluation  Patient identified by MRN, date of birth, ID band Patient awake    Reviewed: Allergy & Precautions, NPO status , Patient's Chart, lab work & pertinent test results  Airway Mallampati: III  TM Distance: >3 FB Neck ROM: Full    Dental  (+) Edentulous Upper, Edentulous Lower   Pulmonary neg pulmonary ROS,    Pulmonary exam normal breath sounds clear to auscultation       Cardiovascular hypertension (181/99 in preop, per pt normally 120s/80s), Pt. on medications Normal cardiovascular exam Rhythm:Regular Rate:Normal  previously another provider had heard a right sided murmur in 2022 which was not heard at 01/2021 cardiology visit or on 11/04/21 PCP visit. He noted patient had declined echo.   Neuro/Psych negative psych ROS   GI/Hepatic Neg liver ROS, GERD  Medicated and Controlled,  Endo/Other  Hyperthyroidism (asymptomatic) Obesity BMI 33  Renal/GU negative Renal ROS  negative genitourinary   Musculoskeletal Chronic pain- takes morphine 15 TID, hydrocodone 10/325 QID for many years Took both this AM 4:30 AM   Abdominal (+) + obese,   Peds  Hematology  (+) REFUSES BLOOD PRODUCTS, Hb 12.5   Anesthesia Other Findings   Reproductive/Obstetrics negative OB ROS                           Anesthesia Physical Anesthesia Plan  ASA: 3  Anesthesia Plan: General   Post-op Pain Management: Tylenol PO (pre-op)*   Induction: Intravenous  PONV Risk Score and Plan: 3 and Ondansetron, Dexamethasone, Midazolam and Treatment may vary due to age or medical condition  Airway Management Planned: Oral ETT  Additional Equipment: None  Intra-op Plan:   Post-operative Plan: Extubation in OR  Informed Consent: I have reviewed the patients History and Physical, chart, labs and discussed the procedure including the risks, benefits and alternatives for the proposed anesthesia with the patient or  authorized representative who has indicated his/her understanding and acceptance.     Dental advisory given  Plan Discussed with: CRNA  Anesthesia Plan Comments:        Anesthesia Quick Evaluation

## 2021-12-24 NOTE — Progress Notes (Signed)
   Office Visit Note   Patient: Meghan Gallegos           Date of Birth: [birth year]           MRN: [id number] Visit Date: 12/24/2021              Requested by: Benjaman Pott, MD No address on file PCP: Benjaman Pott, MD   Assessment & Plan: Visit Diagnoses:  1. Spondylolisthesis, lumbar region   2. History of lumbar fusion   3. Lumbar radiculopathy   4. Degenerative disc disease, lumbar   5. Radiculopathy, unspecified spinal region   6. Spinal stenosis of lumbosacral region   7. Spondylosis without myelopathy or radiculopathy, lumbar region     Plan: Avoid bending, stooping and avoid lifting weights greater than 10 lbs. Avoid prolong standing and walking. Order for a new walker with wheels. Surgery scheduling secretary Tivis Ringer, will call you in the next week to schedule for surgery.  Surgery recommended is a one level lumbar fusion L4-5 this would be done with rods, screws and cages with local bone graft and allograft cancellous chips and BMP. Risk of surgery includes risk of infection 1 in 200 patients, bleeding 0% chance you would need a transfusion.   Risk to the nerves is one in 10,000. You will need to use a brace for 3 months and wean from the brace on the 4th month. Expect improved walking and standing tolerance. Expect relief of leg pain but numbness may persist depending on the length and degree of pressure that has been present.  Follow-Up Instructions: Return in about 4 weeks (around 01/21/2022).   Orders:  No orders of the defined types were placed in this encounter.  No orders of the defined types were placed in this encounter.     Procedures: No procedures performed   Clinical Data: No additional findings.   Subjective: Chief Complaint  Patient presents with   Lower Back - Follow-up    Surgery is scheduled for 12/27/21 and she has not been seen since 38/683    76 year old female with history of low back and left leg pain. Worse with  standing and walking. No bowel or bladder difficulty.  Sheis    Review of Systems   Objective: Vital Signs: BP 121/65 (BP Location: Left Arm, Patient Position: Sitting)   Pulse 77   Ht 5' 4.5" (1.638 m)   Wt 203 lb (92.1 kg)   BMI 34.31 kg/m   Physical Exam  Ortho Exam  Specialty Comments:  No specialty comments available.  Imaging: No results found.   PMFS History: There are no problems to display for this patient.  Past Medical History:  Diagnosis Date   Hypertension    Hyperthyroidism 07/2018   hyerthyroidism/subclilnical hyperthyroidism 07/2018, possibly related to thyroiditis   Pneumonia     No family history on file.  Past Surgical History:  Procedure Laterality Date   BACK SURGERY     CERVICAL FUSION     2011   LAMINECTOMY     1999   LUMBAR FUSION  1999   Social History   Occupational History   Not on file  Tobacco Use   Smoking status: Never   Smokeless tobacco: Never  Vaping Use   Vaping Use: Never used  Substance and Sexual Activity   Alcohol use: Never   Drug use: Never   Sexual activity: Not on file

## 2021-12-24 NOTE — Patient Instructions (Addendum)
Plan: Avoid bending, stooping and avoid lifting weights greater than 10 lbs. Avoid prolong standing and walking. Order for a new walker with wheels. Surgery scheduling secretary Tivis Ringer, will call you in the next week to schedule for surgery.  Surgery recommended is a one level lumbar fusion L4-5 this would be done with rods, screws and cages with local bone graft and allograft cancellous chips and BMP. Risk of surgery includes risk of infection 1 in 200 patients, bleeding 0% chance you would need a transfusion.   Risk to the nerves is one in 10,000. You will need to use a brace for 3 months and wean from the brace on the 4th month. Expect improved walking and standing tolerance. Expect relief of leg pain but numbness may persist depending on the length and degree of pressure that has been present.

## 2021-12-27 ENCOUNTER — Other Ambulatory Visit: Payer: Self-pay

## 2021-12-27 ENCOUNTER — Ambulatory Visit (HOSPITAL_COMMUNITY): Payer: Medicare HMO | Admitting: Vascular Surgery

## 2021-12-27 ENCOUNTER — Ambulatory Visit (HOSPITAL_COMMUNITY): Payer: Medicare HMO

## 2021-12-27 ENCOUNTER — Encounter (HOSPITAL_COMMUNITY): Payer: Self-pay | Admitting: Specialist

## 2021-12-27 ENCOUNTER — Telehealth: Payer: Self-pay | Admitting: Radiology

## 2021-12-27 ENCOUNTER — Observation Stay (HOSPITAL_COMMUNITY)
Admission: RE | Admit: 2021-12-27 | Discharge: 2021-12-28 | Disposition: A | Discharge status: Home / Self Care | Payer: Medicare HMO | Attending: Specialist | Admitting: Specialist

## 2021-12-27 ENCOUNTER — Ambulatory Visit (HOSPITAL_COMMUNITY)
Admission: RE | Disposition: A | Discharge status: Home / Self Care | Payer: Self-pay | Source: Home / Self Care | Attending: Specialist

## 2021-12-27 ENCOUNTER — Ambulatory Visit (HOSPITAL_BASED_OUTPATIENT_CLINIC_OR_DEPARTMENT_OTHER): Payer: Medicare HMO | Admitting: Certified Registered Nurse Anesthetist

## 2021-12-27 DIAGNOSIS — M5137 Other intervertebral disc degeneration, lumbosacral region: Secondary | ICD-10-CM | POA: Insufficient documentation

## 2021-12-27 DIAGNOSIS — E039 Hypothyroidism, unspecified: Secondary | ICD-10-CM | POA: Insufficient documentation

## 2021-12-27 DIAGNOSIS — M48062 Spinal stenosis, lumbar region with neurogenic claudication: Secondary | ICD-10-CM | POA: Diagnosis present

## 2021-12-27 DIAGNOSIS — Z7982 Long term (current) use of aspirin: Secondary | ICD-10-CM | POA: Diagnosis not present

## 2021-12-27 DIAGNOSIS — I10 Essential (primary) hypertension: Secondary | ICD-10-CM | POA: Insufficient documentation

## 2021-12-27 DIAGNOSIS — M4327 Fusion of spine, lumbosacral region: Secondary | ICD-10-CM | POA: Diagnosis not present

## 2021-12-27 DIAGNOSIS — M4316 Spondylolisthesis, lumbar region: Secondary | ICD-10-CM

## 2021-12-27 DIAGNOSIS — M4326 Fusion of spine, lumbar region: Secondary | ICD-10-CM | POA: Diagnosis present

## 2021-12-27 DIAGNOSIS — Z79899 Other long term (current) drug therapy: Secondary | ICD-10-CM | POA: Insufficient documentation

## 2021-12-27 DIAGNOSIS — Z01818 Encounter for other preprocedural examination: Secondary | ICD-10-CM

## 2021-12-27 SURGERY — POSTERIOR LUMBAR FUSION 1 LEVEL
Anesthesia: General | Site: Spine Lumbar

## 2021-12-27 MED ORDER — GABAPENTIN 300 MG PO CAPS
600.0000 mg | ORAL_CAPSULE | Freq: Three times a day (TID) | ORAL | Status: DC
  Administered 2021-12-27 – 2021-12-28 (×3): 600 mg via ORAL
  Filled 2021-12-27 (×3): qty 2

## 2021-12-27 MED ORDER — FENTANYL CITRATE (PF) 250 MCG/5ML IJ SOLN
INTRAMUSCULAR | Status: DC | PRN
  Administered 2021-12-27 (×2): 20 ug via INTRAVENOUS
  Administered 2021-12-27: 100 ug via INTRAVENOUS

## 2021-12-27 MED ORDER — TRANEXAMIC ACID-NACL 1000-0.7 MG/100ML-% IV SOLN
INTRAVENOUS | Status: DC | PRN
Start: 2021-12-27 — End: 2021-12-27
  Administered 2021-12-27: 1000 mg via INTRAVENOUS

## 2021-12-27 MED ORDER — MIDAZOLAM HCL 5 MG/5ML IJ SOLN
INTRAMUSCULAR | Status: DC | PRN
  Administered 2021-12-27: 1.5 mg via INTRAVENOUS
  Administered 2021-12-27: .5 mg via INTRAVENOUS

## 2021-12-27 MED ORDER — PANTOPRAZOLE SODIUM 40 MG PO TBEC
40.0000 mg | DELAYED_RELEASE_TABLET | Freq: Every morning | ORAL | Status: DC
  Administered 2021-12-28: 40 mg via ORAL
  Filled 2021-12-27: qty 1

## 2021-12-27 MED ORDER — PHENOL 1.4 % MT LIQD: 1.0000 | OROMUCOSAL | Status: DC | PRN

## 2021-12-27 MED ORDER — ONDANSETRON HCL 4 MG/2ML IJ SOLN
INTRAMUSCULAR | Status: DC | PRN
  Administered 2021-12-27: 4 mg via INTRAVENOUS

## 2021-12-27 MED ORDER — MIDAZOLAM HCL 2 MG/2ML IJ SOLN
INTRAMUSCULAR | Status: AC
  Filled 2021-12-27: qty 2

## 2021-12-27 MED ORDER — OXYCODONE HCL 5 MG/5ML PO SOLN: 5.0000 mg | Freq: Once | ORAL | Status: AC | PRN

## 2021-12-27 MED ORDER — ONDANSETRON HCL 4 MG/2ML IJ SOLN: 4.0000 mg | Freq: Once | INTRAMUSCULAR | Status: DC | PRN

## 2021-12-27 MED ORDER — KETAMINE HCL 50 MG/5ML IJ SOSY
PREFILLED_SYRINGE | INTRAMUSCULAR | Status: AC
  Filled 2021-12-27: qty 5

## 2021-12-27 MED ORDER — THROMBIN 20000 UNITS EX SOLR
CUTANEOUS | Status: DC | PRN
  Administered 2021-12-27: 20 mL via TOPICAL

## 2021-12-27 MED ORDER — METHOCARBAMOL 500 MG PO TABS
500.0000 mg | ORAL_TABLET | Freq: Four times a day (QID) | ORAL | Status: DC | PRN
  Administered 2021-12-27 – 2021-12-28 (×3): 500 mg via ORAL
  Filled 2021-12-27 (×3): qty 1

## 2021-12-27 MED ORDER — ROCURONIUM BROMIDE 10 MG/ML (PF) SYRINGE
PREFILLED_SYRINGE | INTRAVENOUS | Status: DC | PRN
  Administered 2021-12-27: 30 mg via INTRAVENOUS
  Administered 2021-12-27: 100 mg via INTRAVENOUS
  Administered 2021-12-27: 50 mg via INTRAVENOUS

## 2021-12-27 MED ORDER — ADULT MULTIVITAMIN W/MINERALS CH
1.0000 | ORAL_TABLET | Freq: Every evening | ORAL | Status: DC
  Administered 2021-12-27: 1 via ORAL
  Filled 2021-12-27: qty 1

## 2021-12-27 MED ORDER — ROCURONIUM BROMIDE 10 MG/ML (PF) SYRINGE
PREFILLED_SYRINGE | INTRAVENOUS | Status: AC
  Filled 2021-12-27: qty 10

## 2021-12-27 MED ORDER — BIOTIN 300 MCG PO TABS
ORAL_TABLET | Freq: Every day | ORAL | Status: DC
Start: 2021-12-27 — End: 2021-12-27

## 2021-12-27 MED ORDER — OXYCODONE HCL 5 MG PO TABS
10.0000 mg | ORAL_TABLET | ORAL | Status: DC | PRN
  Administered 2021-12-27 – 2021-12-28 (×4): 10 mg via ORAL
  Filled 2021-12-27 (×5): qty 2

## 2021-12-27 MED ORDER — OXYCODONE HCL 5 MG PO TABS
ORAL_TABLET | ORAL | Status: AC
  Filled 2021-12-27: qty 1

## 2021-12-27 MED ORDER — PROPOFOL 10 MG/ML IV BOLUS
INTRAVENOUS | Status: AC
  Filled 2021-12-27: qty 20

## 2021-12-27 MED ORDER — HYDROMORPHONE HCL 1 MG/ML IJ SOLN
0.2500 mg | INTRAMUSCULAR | Status: DC | PRN
  Administered 2021-12-27 (×4): 0.5 mg via INTRAVENOUS

## 2021-12-27 MED ORDER — MENTHOL 3 MG MT LOZG: 1.0000 | LOZENGE | OROMUCOSAL | Status: DC | PRN

## 2021-12-27 MED ORDER — FLEET ENEMA 7-19 GM/118ML RE ENEM: 1.0000 | ENEMA | Freq: Once | RECTAL | Status: DC | PRN

## 2021-12-27 MED ORDER — VITAMIN D-3 125 MCG (5000 UT) PO TABS: 5000.0000 [IU] | ORAL_TABLET | Freq: Every evening | ORAL | Status: DC

## 2021-12-27 MED ORDER — SODIUM CHLORIDE 0.9 % IV SOLN: INTRAVENOUS | Status: DC

## 2021-12-27 MED ORDER — MAGNESIUM SULFATE 50 % IJ SOLN
INTRAMUSCULAR | Status: DC | PRN
  Administered 2021-12-27: 316 mg via INTRAVENOUS
  Administered 2021-12-27 (×2): 342 mg via INTRAVENOUS

## 2021-12-27 MED ORDER — ONDANSETRON HCL 4 MG PO TABS: 4.0000 mg | ORAL_TABLET | Freq: Four times a day (QID) | ORAL | Status: DC | PRN

## 2021-12-27 MED ORDER — LIDOCAINE 2% (20 MG/ML) 5 ML SYRINGE
INTRAMUSCULAR | Status: AC
  Filled 2021-12-27: qty 5

## 2021-12-27 MED ORDER — CEFAZOLIN SODIUM-DEXTROSE 2-4 GM/100ML-% IV SOLN
INTRAVENOUS | Status: AC
  Filled 2021-12-27: qty 100

## 2021-12-27 MED ORDER — DEXAMETHASONE SODIUM PHOSPHATE 10 MG/ML IJ SOLN
INTRAMUSCULAR | Status: AC
  Filled 2021-12-27: qty 1

## 2021-12-27 MED ORDER — LIDOCAINE 2% (20 MG/ML) 5 ML SYRINGE
INTRAMUSCULAR | Status: DC | PRN
  Administered 2021-12-27: 60 mg via INTRAVENOUS

## 2021-12-27 MED ORDER — ACETAMINOPHEN 325 MG PO TABS
650.0000 mg | ORAL_TABLET | ORAL | Status: DC | PRN
  Administered 2021-12-27: 650 mg via ORAL
  Filled 2021-12-27: qty 2

## 2021-12-27 MED ORDER — SODIUM CHLORIDE 0.9% FLUSH: 3.0000 mL | INTRAVENOUS | Status: DC | PRN

## 2021-12-27 MED ORDER — BUPIVACAINE LIPOSOME 1.3 % IJ SUSP
INTRAMUSCULAR | Status: AC
  Filled 2021-12-27: qty 20

## 2021-12-27 MED ORDER — POLYETHYLENE GLYCOL 3350 17 G PO PACK: 17.0000 g | PACK | Freq: Every day | ORAL | Status: DC | PRN

## 2021-12-27 MED ORDER — DEXAMETHASONE SODIUM PHOSPHATE 10 MG/ML IJ SOLN
INTRAMUSCULAR | Status: DC | PRN
  Administered 2021-12-27: 10 mg via INTRAVENOUS

## 2021-12-27 MED ORDER — ORAL CARE MOUTH RINSE: 15.0000 mL | Freq: Once | OROMUCOSAL | Status: AC

## 2021-12-27 MED ORDER — BISACODYL 5 MG PO TBEC: 5.0000 mg | DELAYED_RELEASE_TABLET | Freq: Every day | ORAL | Status: DC | PRN

## 2021-12-27 MED ORDER — CHLORHEXIDINE GLUCONATE 0.12 % MT SOLN
OROMUCOSAL | Status: AC
  Administered 2021-12-27: 15 mL via OROMUCOSAL
  Filled 2021-12-27: qty 15

## 2021-12-27 MED ORDER — CEFAZOLIN SODIUM-DEXTROSE 2-4 GM/100ML-% IV SOLN
2.0000 g | INTRAVENOUS | Status: AC
  Administered 2021-12-27: 2 g via INTRAVENOUS

## 2021-12-27 MED ORDER — OXYCODONE HCL ER 10 MG PO T12A
20.0000 mg | EXTENDED_RELEASE_TABLET | Freq: Two times a day (BID) | ORAL | Status: DC
  Administered 2021-12-27: 20 mg via ORAL
  Filled 2021-12-27: qty 2

## 2021-12-27 MED ORDER — PHENYLEPHRINE HCL (PRESSORS) 10 MG/ML IV SOLN
INTRAVENOUS | Status: DC | PRN
  Administered 2021-12-27: 40 ug via INTRAVENOUS
  Administered 2021-12-27 (×2): 80 ug via INTRAVENOUS
  Administered 2021-12-27: 180 ug via INTRAVENOUS
  Administered 2021-12-27: 40 ug via INTRAVENOUS
  Administered 2021-12-27: 160 ug via INTRAVENOUS

## 2021-12-27 MED ORDER — HYDROMORPHONE HCL 1 MG/ML IJ SOLN
INTRAMUSCULAR | Status: AC
Start: 2021-12-27 — End: 2021-12-28
  Filled 2021-12-27: qty 1

## 2021-12-27 MED ORDER — PHENYLEPHRINE HCL-NACL 20-0.9 MG/250ML-% IV SOLN
INTRAVENOUS | Status: DC | PRN
Start: 2021-12-27 — End: 2021-12-27
  Administered 2021-12-27: 50 ug/min via INTRAVENOUS

## 2021-12-27 MED ORDER — BUPIVACAINE HCL (PF) 0.5 % IJ SOLN
INTRAMUSCULAR | Status: AC
  Filled 2021-12-27: qty 30

## 2021-12-27 MED ORDER — FENTANYL CITRATE (PF) 250 MCG/5ML IJ SOLN
INTRAMUSCULAR | Status: AC
  Filled 2021-12-27: qty 5

## 2021-12-27 MED ORDER — ONDANSETRON HCL 4 MG/2ML IJ SOLN: 4.0000 mg | Freq: Four times a day (QID) | INTRAMUSCULAR | Status: DC | PRN

## 2021-12-27 MED ORDER — ARTIFICIAL TEARS OPHTHALMIC OINT
TOPICAL_OINTMENT | OPHTHALMIC | Status: DC | PRN
  Administered 2021-12-27: 1 via OPHTHALMIC

## 2021-12-27 MED ORDER — LOSARTAN POTASSIUM-HCTZ 50-12.5 MG PO TABS
1.0000 | ORAL_TABLET | Freq: Every morning | ORAL | Status: DC
Start: 2021-12-28 — End: 2021-12-27

## 2021-12-27 MED ORDER — OXYCODONE HCL 5 MG PO TABS
5.0000 mg | ORAL_TABLET | Freq: Once | ORAL | Status: AC | PRN
  Administered 2021-12-27: 5 mg via ORAL

## 2021-12-27 MED ORDER — CEFAZOLIN SODIUM-DEXTROSE 2-4 GM/100ML-% IV SOLN
2.0000 g | Freq: Three times a day (TID) | INTRAVENOUS | Status: AC
  Administered 2021-12-27 – 2021-12-28 (×2): 2 g via INTRAVENOUS
  Filled 2021-12-27 (×2): qty 100

## 2021-12-27 MED ORDER — LOSARTAN POTASSIUM 50 MG PO TABS
50.0000 mg | ORAL_TABLET | Freq: Every day | ORAL | Status: DC
  Administered 2021-12-28: 50 mg via ORAL
  Filled 2021-12-27: qty 1

## 2021-12-27 MED ORDER — SENNA 8.6 MG PO TABS
4.0000 | ORAL_TABLET | Freq: Every day | ORAL | Status: DC
  Administered 2021-12-27: 34.4 mg via ORAL
  Filled 2021-12-27: qty 4

## 2021-12-27 MED ORDER — LORATADINE 10 MG PO TABS
10.0000 mg | ORAL_TABLET | Freq: Every day | ORAL | Status: DC
  Administered 2021-12-27 – 2021-12-28 (×2): 10 mg via ORAL
  Filled 2021-12-27 (×2): qty 1

## 2021-12-27 MED ORDER — DOCUSATE SODIUM 100 MG PO CAPS
100.0000 mg | ORAL_CAPSULE | Freq: Two times a day (BID) | ORAL | Status: DC
  Administered 2021-12-27 – 2021-12-28 (×2): 100 mg via ORAL
  Filled 2021-12-27 (×2): qty 1

## 2021-12-27 MED ORDER — HYDROCHLOROTHIAZIDE 12.5 MG PO TABS
12.5000 mg | ORAL_TABLET | Freq: Every day | ORAL | Status: DC
  Administered 2021-12-28: 12.5 mg via ORAL
  Filled 2021-12-27: qty 1

## 2021-12-27 MED ORDER — LACTATED RINGERS IV SOLN: INTRAVENOUS | Status: DC

## 2021-12-27 MED ORDER — DULOXETINE HCL 60 MG PO CPEP
60.0000 mg | ORAL_CAPSULE | Freq: Every day | ORAL | Status: DC
  Administered 2021-12-27: 60 mg via ORAL
  Filled 2021-12-27: qty 1

## 2021-12-27 MED ORDER — 0.9 % SODIUM CHLORIDE (POUR BTL) OPTIME
TOPICAL | Status: DC | PRN
  Administered 2021-12-27: 1000 mL

## 2021-12-27 MED ORDER — ONDANSETRON HCL 4 MG/2ML IJ SOLN
INTRAMUSCULAR | Status: AC
  Filled 2021-12-27: qty 2

## 2021-12-27 MED ORDER — HYDROMORPHONE HCL 1 MG/ML IJ SOLN
INTRAMUSCULAR | Status: AC
  Filled 2021-12-27: qty 1

## 2021-12-27 MED ORDER — ACETAMINOPHEN 650 MG RE SUPP: 650.0000 mg | RECTAL | Status: DC | PRN

## 2021-12-27 MED ORDER — BUPIVACAINE LIPOSOME 1.3 % IJ SUSP
10.0000 mL | Freq: Once | INTRAMUSCULAR | Status: DC
  Filled 2021-12-27: qty 10

## 2021-12-27 MED ORDER — KETAMINE HCL 10 MG/ML IJ SOLN
INTRAMUSCULAR | Status: DC | PRN
Start: 2021-12-27 — End: 2021-12-27
  Administered 2021-12-27 (×2): 6 mg via INTRAVENOUS
  Administered 2021-12-27: 10 mg via INTRAVENOUS
  Administered 2021-12-27: 40 mg via INTRAVENOUS

## 2021-12-27 MED ORDER — TRANEXAMIC ACID-NACL 1000-0.7 MG/100ML-% IV SOLN
INTRAVENOUS | Status: AC
  Filled 2021-12-27: qty 100

## 2021-12-27 MED ORDER — METHOCARBAMOL 1000 MG/10ML IJ SOLN
500.0000 mg | Freq: Four times a day (QID) | INTRAVENOUS | Status: DC | PRN
  Filled 2021-12-27: qty 5

## 2021-12-27 MED ORDER — MORPHINE SULFATE ER 15 MG PO TBCR: 15.0000 mg | EXTENDED_RELEASE_TABLET | Freq: Two times a day (BID) | ORAL | Status: DC

## 2021-12-27 MED ORDER — SODIUM CHLORIDE 0.9% FLUSH
3.0000 mL | Freq: Two times a day (BID) | INTRAVENOUS | Status: DC
  Administered 2021-12-27 – 2021-12-28 (×2): 3 mL via INTRAVENOUS

## 2021-12-27 MED ORDER — BACLOFEN 10 MG PO TABS
10.0000 mg | ORAL_TABLET | Freq: Every day | ORAL | Status: DC
Start: 2021-12-27 — End: 2021-12-28
  Administered 2021-12-27: 10 mg via ORAL
  Filled 2021-12-27: qty 1

## 2021-12-27 MED ORDER — AMISULPRIDE (ANTIEMETIC) 5 MG/2ML IV SOLN: 10.0000 mg | Freq: Once | INTRAVENOUS | Status: DC | PRN

## 2021-12-27 MED ORDER — ARTIFICIAL TEARS OPHTHALMIC OINT
TOPICAL_OINTMENT | OPHTHALMIC | Status: AC
  Filled 2021-12-27: qty 3.5

## 2021-12-27 MED ORDER — TRAZODONE HCL 50 MG PO TABS
25.0000 mg | ORAL_TABLET | Freq: Every evening | ORAL | Status: DC | PRN
  Administered 2021-12-27: 12.5 mg via ORAL
  Filled 2021-12-27: qty 1

## 2021-12-27 MED ORDER — POTASSIUM 99 MG PO TABS: 99.0000 mg | ORAL_TABLET | Freq: Every day | ORAL | Status: DC

## 2021-12-27 MED ORDER — ATORVASTATIN CALCIUM 10 MG PO TABS
20.0000 mg | ORAL_TABLET | Freq: Every day | ORAL | Status: DC
  Administered 2021-12-27: 20 mg via ORAL
  Filled 2021-12-27: qty 2

## 2021-12-27 MED ORDER — OXYCODONE HCL 5 MG PO TABS: 5.0000 mg | ORAL_TABLET | ORAL | Status: DC | PRN

## 2021-12-27 MED ORDER — BUPIVACAINE LIPOSOME 1.3 % IJ SUSP
INTRAMUSCULAR | Status: DC | PRN
  Administered 2021-12-27: 20 mL via INTRAMUSCULAR

## 2021-12-27 MED ORDER — SUGAMMADEX SODIUM 200 MG/2ML IV SOLN
INTRAVENOUS | Status: DC | PRN
  Administered 2021-12-27: 300 mg via INTRAVENOUS

## 2021-12-27 MED ORDER — MELOXICAM 7.5 MG PO TABS
15.0000 mg | ORAL_TABLET | Freq: Every day | ORAL | Status: DC
  Administered 2021-12-27: 15 mg via ORAL
  Filled 2021-12-27: qty 2

## 2021-12-27 MED ORDER — ALUM & MAG HYDROXIDE-SIMETH 200-200-20 MG/5ML PO SUSP: 30.0000 mL | Freq: Four times a day (QID) | ORAL | Status: DC | PRN

## 2021-12-27 MED ORDER — SODIUM CHLORIDE 0.9 % IV SOLN: 250.0000 mL | INTRAVENOUS | Status: DC

## 2021-12-27 MED ORDER — PROPOFOL 10 MG/ML IV BOLUS
INTRAVENOUS | Status: DC | PRN
Start: 2021-12-27 — End: 2021-12-27
  Administered 2021-12-27: 30 mg via INTRAVENOUS
  Administered 2021-12-27: 130 mg via INTRAVENOUS

## 2021-12-27 MED ORDER — ACETAMINOPHEN 500 MG PO TABS
1000.0000 mg | ORAL_TABLET | Freq: Once | ORAL | Status: AC
  Administered 2021-12-27: 1000 mg via ORAL
  Filled 2021-12-27: qty 2

## 2021-12-27 MED ORDER — CHLORHEXIDINE GLUCONATE 0.12 % MT SOLN: 15.0000 mL | Freq: Once | OROMUCOSAL | Status: AC

## 2021-12-27 MED ORDER — THROMBIN 20000 UNITS EX KIT
PACK | CUTANEOUS | Status: AC
  Filled 2021-12-27: qty 1

## 2021-12-27 SURGICAL SUPPLY — 77 items
BAG COUNTER SPONGE SURGICOUNT (BAG) ×2 IMPLANT
BLADE CLIPPER SURG (BLADE) IMPLANT
BONE CANC CHIPS 20CC PCAN1/4 (Bone Implant) ×2 IMPLANT
BUR MATCHSTICK NEURO 3.0 LAGG (BURR) ×2 IMPLANT
BUR SABER RD CUTTING 3.0 (BURR) IMPLANT
CAGE SABLE 10X26 8D (Cage) ×1 IMPLANT
CAP LOCKING THREADED (Cap) ×4 IMPLANT
CHIPS CANC BONE 20CC PCAN1/4 (Bone Implant) ×1 IMPLANT
COVER BACK TABLE 80X110 HD (DRAPES) ×2 IMPLANT
COVER SURGICAL LIGHT HANDLE (MISCELLANEOUS) ×2 IMPLANT
DECANTER SPIKE VIAL GLASS SM (MISCELLANEOUS) ×1 IMPLANT
DERMABOND ADVANCED (GAUZE/BANDAGES/DRESSINGS)
DERMABOND ADVANCED .7 DNX12 (GAUZE/BANDAGES/DRESSINGS) ×1 IMPLANT
DRAPE C-ARM 42X72 X-RAY (DRAPES) ×2 IMPLANT
DRAPE C-ARMOR (DRAPES) ×2 IMPLANT
DRAPE MICROSCOPE LEICA (MISCELLANEOUS) ×2 IMPLANT
DRAPE POUCH INSTRU U-SHP 10X18 (DRAPES) ×2 IMPLANT
DRAPE SURG 17X23 STRL (DRAPES) ×6 IMPLANT
DRSG MEPILEX BORDER 4X4 (GAUZE/BANDAGES/DRESSINGS) IMPLANT
DRSG MEPILEX BORDER 4X8 (GAUZE/BANDAGES/DRESSINGS) ×1 IMPLANT
DURAPREP 26ML APPLICATOR (WOUND CARE) ×2 IMPLANT
DURASEAL SPINE SEALANT 5 POLY (MISCELLANEOUS) ×1 IMPLANT
ELECT BLADE 4.0 EZ CLEAN MEGAD (MISCELLANEOUS) ×2
ELECT BLADE 6.5 EXT (BLADE) ×1 IMPLANT
ELECT CAUTERY BLADE 6.4 (BLADE) ×1 IMPLANT
ELECT REM PT RETURN 9FT ADLT (ELECTROSURGICAL) ×2
ELECTRODE BLDE 4.0 EZ CLN MEGD (MISCELLANEOUS) ×1 IMPLANT
ELECTRODE REM PT RTRN 9FT ADLT (ELECTROSURGICAL) ×1 IMPLANT
EVACUATOR 1/8 PVC DRAIN (DRAIN) IMPLANT
GAUZE SPONGE 4X4 12PLY STRL (GAUZE/BANDAGES/DRESSINGS) ×1 IMPLANT
GLOVE BIOGEL PI IND STRL 8 (GLOVE) ×1 IMPLANT
GLOVE BIOGEL PI INDICATOR 8 (GLOVE) ×1
GLOVE ECLIPSE 9.0 STRL (GLOVE) ×2 IMPLANT
GLOVE ORTHO TXT STRL SZ7.5 (GLOVE) ×2 IMPLANT
GLOVE SURG 8.5 LATEX PF (GLOVE) ×2 IMPLANT
GOWN STRL REUS W/ TWL LRG LVL3 (GOWN DISPOSABLE) ×1 IMPLANT
GOWN STRL REUS W/TWL 2XL LVL3 (GOWN DISPOSABLE) ×4 IMPLANT
GOWN STRL REUS W/TWL LRG LVL3 (GOWN DISPOSABLE) ×1
GRAFT BNE CANC CHIPS 1-8 20CC (Bone Implant) IMPLANT
KIT BASIN OR (CUSTOM PROCEDURE TRAY) ×2 IMPLANT
KIT INFUSE SMALL (Orthopedic Implant) ×1 IMPLANT
KIT POSITION SURG JACKSON T1 (MISCELLANEOUS) ×2 IMPLANT
KIT TURNOVER KIT B (KITS) ×2 IMPLANT
NDL SPNL 18GX3.5 QUINCKE PK (NEEDLE) ×1 IMPLANT
NEEDLE 22X1 1/2 (OR ONLY) (NEEDLE) ×2 IMPLANT
NEEDLE SPNL 18GX3.5 QUINCKE PK (NEEDLE) ×2 IMPLANT
NS IRRIG 1000ML POUR BTL (IV SOLUTION) ×2 IMPLANT
PACK LAMINECTOMY ORTHO (CUSTOM PROCEDURE TRAY) ×2 IMPLANT
PAD ARMBOARD 7.5X6 YLW CONV (MISCELLANEOUS) ×4 IMPLANT
PATTIES SURGICAL .75X.75 (GAUZE/BANDAGES/DRESSINGS) ×2 IMPLANT
PATTIES SURGICAL 1X1 (DISPOSABLE) ×2 IMPLANT
ROD 40MM SPINAL (Rod) ×1 IMPLANT
ROD SPINAL 35MM (Rod) ×1 IMPLANT
SCREW CREO ONE ROB MOD 7.5X55 (Screw) ×2 IMPLANT
SCREW MOD SD CREO 7.5X50 (Screw) ×2 IMPLANT
SCREW PA THRD CREO TULIP 5.5X4 (Head) ×4 IMPLANT
SPOGE SURGIFLO 8M (HEMOSTASIS)
SPONGE SURGIFLO 8M (HEMOSTASIS) IMPLANT
SPONGE SURGIFOAM ABS GEL 100 (HEMOSTASIS) ×2 IMPLANT
SUT VIC AB 0 CT1 27 (SUTURE) ×1
SUT VIC AB 0 CT1 27XBRD ANBCTR (SUTURE) ×1 IMPLANT
SUT VIC AB 1 CTX 36 (SUTURE) ×2
SUT VIC AB 1 CTX36XBRD ANBCTR (SUTURE) ×2 IMPLANT
SUT VIC AB 2-0 CT1 27 (SUTURE) ×1
SUT VIC AB 2-0 CT1 TAPERPNT 27 (SUTURE) ×1 IMPLANT
SUT VIC AB 3-0 X1 27 (SUTURE) ×2 IMPLANT
SYR 20ML LL LF (SYRINGE) ×2 IMPLANT
SYR CONTROL 10ML LL (SYRINGE) ×3 IMPLANT
TAP SURG AMP CREO 4.5 (TAP) ×1 IMPLANT
TAP SURG AMP CREO 5.5 (TAP) ×1 IMPLANT
TAP SURG AMP CREO 6.5 (TAP) ×1 IMPLANT
TAP SURG BEACON GLBU 7.5 (TAP) ×1 IMPLANT
TOWEL GREEN STERILE (TOWEL DISPOSABLE) ×2 IMPLANT
TOWEL GREEN STERILE FF (TOWEL DISPOSABLE) ×2 IMPLANT
TRAY FOLEY MTR SLVR 16FR STAT (SET/KITS/TRAYS/PACK) ×2 IMPLANT
WATER STERILE IRR 1000ML POUR (IV SOLUTION) ×1 IMPLANT
YANKAUER SUCT BULB TIP NO VENT (SUCTIONS) ×2 IMPLANT

## 2021-12-27 NOTE — Interval H&P Note (Signed)
History and Physical Interval Note:  12/27/2021 7:47 AM  Meghan Gallegos  has presented today for surgery, with the diagnosis of L4-5 spondylolisthesis above L5-S1 anterior fusion with degenerative disc disease.  The various methods of treatment have been discussed with the patient and family. After consideration of risks, benefits and other options for treatment, the patient has consented to  Procedure(s): LEFT L4-5 TRANSFORAMINAL LUMBAR INTERBODY FUSION WITH PEDICLE SCREWS, RODS AND ADJUSTABLE CAGE, LOCAL BONE GRAFT, ALLOGRAFT BONE GRAFT (N/A) as a surgical intervention.  The patient's history has been reviewed, patient examined, no change in status, stable for surgery.  I have reviewed the patient's chart and labs.  Questions were answered to the patient's satisfaction.     Vira Browns

## 2021-12-27 NOTE — Telephone Encounter (Signed)
Patient called and was transferred to triage. She is currently in the hospital after having a lumbar fusion this morning. She got up to her room post op and was in tremendous pain. She states that she spoke with staff about it, but then heard them discussing lunch and laid there in horrible pain x one hour. She asked her husband to bring the pain medication that she usually takes (hydrocodone) and she took one of those. The nurse then brought in Oxycodone, but told the patient that she could not give it to her since she took her hydrocodone from home. The hydrocodone was not enough to help her. She states that now they are sending a request to the pharmacy to "clear her meds". She is unsure what this entails as her medications were reviewed prior to surgery.  Patient is stating that she wants to check herself out of the hospital as she cannot lay there in this type of pain.  She would like a return call.   CB [phone] or call her husband Gerlene Burdock who is there with her at [phone]

## 2021-12-27 NOTE — Progress Notes (Signed)
Pt called out for pain medications.RN asked charge RN to take medications at 1545. PRN oxycodone not due until 1607 as PACU nurse had given dose at 1307. Pt stating that her pain is not managed and told RN that she asked her husband for her hydrocodone pain medication from home and took them. Charge RN explained to pt that she can not take home medications without staff knowledge.   1645- Upon entrance into room, Pt stated she had called MDs office and spoke with triage nurse about talking to Dr.Nitka. RN called Dr.Nitka at pts bedside who stated he would adjust pain medicines as pt still stating that her pain is not managed. See MAR for new orders. No IV pain medications ordered.

## 2021-12-27 NOTE — Progress Notes (Signed)
Nightshift RN, Clearnce Hasten, and dayshift RN, Deanna Artis, gave bedside shift report with husband present.  We discussed the dangers and risks of taking home pain medications as well as hospital prescribed pain medications.  I asked patient if she had any additional pain medication in her purse and she stated, "yes."  Husband stated that he is leaving tonight and will take the prescription bottle of medication with him.  Patient stated she will not take any more home medications while she is here.  RN will continue to monitor situation at this time.  12/28/21 00:35  Pt's husband leaving for the evening.  Husband showed RN that he had her pill bottle with her home medication.

## 2021-12-27 NOTE — Progress Notes (Signed)
Orthopedic Tech Progress Note Patient Details:  Meghan Gallegos Patient Jul 11, 1946 [id number]  PACU RN called requesting a  LSO BRACE, dropped at to bedside table   Ortho Devices Type of Ortho Device: Lumbar corsett Ortho Device/Splint Location: BACK Ortho Device/Splint Interventions: Ordered   Post Interventions Patient Tolerated: Well Instructions Provided: Care of device  Donald Pore 12/27/2021, 12:52 PM

## 2021-12-27 NOTE — TOC Transition Note (Signed)
Transition of Care [facility]) - CM/SW Discharge Note   Patient Details  Name: Meghan Gallegos MRN: [id number] Date of Birth: 09/16/1945  [facility]) CM/SW Contact:  Angelita Ingles, RN Phone [phone]  12/27/2021, 1:49 PM   Clinical Narrative:    [facility] acknowledges general consult for PT / OT / SLP / DME as needed. Currently no needs are identified. TOC will follow.         Patient Goals and CMS Choice        Discharge Placement                       Discharge Plan and Services                                     Social Determinants of Health (SDOH) Interventions     Readmission Risk Interventions     View : No data to display.

## 2021-12-27 NOTE — Transfer of Care (Signed)
Immediate Anesthesia Transfer of Care Note  Patient: Meghan Gallegos  Procedure(s) Performed: LEFT LUMBAR FOUR- FIVE TRANSFORAMINAL LUMBAR INTERBODY FUSION WITH PEDICLE SCREWS, RODS AND ADJUSTABLE CAGE, LOCAL BONE GRAFT, ALLOGRAFT BONE GRAFT (Spine Lumbar)  Patient Location: PACU  Anesthesia Type:General  Level of Consciousness: drowsy  Airway & Oxygen Therapy: Patient Spontanous Breathing and Patient connected to face mask oxygen  Post-op Assessment: Report given to RN and Post -op Vital signs reviewed and stable  Post vital signs: Reviewed and stable  Last Vitals:  Vitals Value Taken Time  BP 131/74 12/27/21 1200  Temp    Pulse 77 12/27/21 1202  Resp 16 12/27/21 1202  SpO2 94 % 12/27/21 1202  Vitals shown include unvalidated device data.  Last Pain:  Vitals:   12/27/21 0613  TempSrc:   PainSc: 0-No pain         Complications: No notable events documented.

## 2021-12-27 NOTE — Progress Notes (Signed)
Did not give pt Oxy at 1545 due to pt stating that she had just taken 2 of her Hydrocodone from home.  Pt advised she was not allowed to take home meds unless they are checked in through the pharmacy.  Pt's nurse [facility] advised.

## 2021-12-27 NOTE — Anesthesia Procedure Notes (Signed)
Procedure Name: Intubation Date/Time: 12/27/2021 8:01 AM Performed by: Maude Leriche, CRNA Pre-anesthesia Checklist: Patient identified, Emergency Drugs available, Suction available and Patient being monitored Patient Re-evaluated:Patient Re-evaluated prior to induction Oxygen Delivery Method: Circle system utilized Preoxygenation: Pre-oxygenation with 100% oxygen Induction Type: IV induction Ventilation: Mask ventilation without difficulty Laryngoscope Size: Miller and 2 Grade View: Grade I Tube type: Oral Tube size: 7.0 mm Number of attempts: 1 Airway Equipment and Method: Stylet Placement Confirmation: ETT inserted through vocal cords under direct vision, positive ETCO2 and breath sounds checked- equal and bilateral Secured at: 21 cm Tube secured with: Tape Dental Injury: Teeth and Oropharynx as per pre-operative assessment

## 2021-12-27 NOTE — H&P (Signed)
PREOPERATIVE H&P  Chief Complaint: L4-5 spondylolisthesis above L5-S1 anterior fusion with degenerative disc disease  HPI: Meghan Gallegos is a 76 y.o. female who presents for preoperative history and physical with a diagnosis of L4-5 spondylolisthesis above L5-S1 anterior fusion with degenerative disc disease. Symptoms are rated as moderate to severe, and have been worsening.  This is significantly impairing activities of daily living.  She has elected for surgical management.   Past Medical History:  Diagnosis Date   Hypertension    Hyperthyroidism 07/2018   hyerthyroidism/subclilnical hyperthyroidism 07/2018, possibly related to thyroiditis   Pneumonia    Past Surgical History:  Procedure Laterality Date   BACK SURGERY     CERVICAL FUSION     2011   LAMINECTOMY     1999   LUMBAR FUSION  1999   Social History   Socioeconomic History   Marital status: Married    Spouse name: Not on file   Number of children: Not on file   Years of education: Not on file   Highest education level: Not on file  Occupational History   Not on file  Tobacco Use   Smoking status: Never   Smokeless tobacco: Never  Vaping Use   Vaping Use: Never used  Substance and Sexual Activity   Alcohol use: Never   Drug use: Never   Sexual activity: Not on file  Other Topics Concern   Not on file  Social History Narrative   Not on file   Social Determinants of Health   Financial Resource Strain: Not on file  Food Insecurity: Not on file  Transportation Needs: Not on file  Physical Activity: Not on file  Stress: Not on file  Social Connections: Not on file   History reviewed. No pertinent family history. Allergies  Allergen Reactions   Wheat Bran Other (See Comments)    Allergy testing indication   Prior to Admission medications   Medication Sig Start Date End Date Taking? Authorizing Provider  APPLE CIDER VINEGAR PO Take 1,250 mg by mouth every evening. 625 mg/tablet   Yes [provider]  aspirin 325 MG tablet Take 325 mg by mouth daily.   Yes [provider]  atorvastatin (LIPITOR) 20 MG tablet Take 20 mg by mouth daily in the afternoon. 11/04/21  Yes [provider]  baclofen (LIORESAL) 10 MG tablet Take 10 mg by mouth daily at 6 PM. (1700) 08/12/14  Yes [provider]  BIOTIN PO Take 2 tablets by mouth daily. Gummies   Yes [provider]  cetirizine (ZYRTEC) 10 MG tablet Take 10 mg by mouth in the morning.   Yes [provider]  Cholecalciferol (VITAMIN D-3) 125 MCG (5000 UT) TABS Take 5,000 Units by mouth every evening.   Yes [provider]  ciclopirox (PENLAC) 8 % solution Apply 1 application. topically at bedtime. 11/04/21  Yes [provider]  DULoxetine (CYMBALTA) 60 MG capsule Take 60 mg by mouth daily at 6 PM. (1700) 11/08/21  Yes [provider]  gabapentin (NEURONTIN) 300 MG capsule Take 600 mg by mouth in the morning, at noon, in the evening, and at bedtime. 11/23/21  Yes [provider]  Chilton Si Tea, Camellia sinensis, (GREEN TEA PO) Take 400 mg by mouth in the morning.   Yes [provider]  HYDROcodone-acetaminophen (NORCO) 10-325 MG tablet Take 1 tablet by mouth in the morning, at noon, in the evening, and at bedtime. 08/12/14  Yes [provider]  losartan-hydrochlorothiazide (HYZAAR) 50-12.5  MG tablet Take 1 tablet by mouth in the morning. 06/18/14  Yes [provider]  meloxicam (MOBIC) 15 MG tablet Take 1 tablet (15 mg total) by mouth daily. Patient taking differently: Take 15 mg by mouth daily at 4 PM. (Late afternoon) 02/17/21  Yes Kerrin Champagne, MD  morphine (MS CONTIN) 15 MG 12 hr tablet Take 15 mg by mouth in the morning, at noon, and at bedtime. 08/12/14  Yes [provider]  Multiple Vitamins-Minerals (ADULT ONE DAILY GUMMIES PO) Take 2 tablets by mouth every evening. Vitafusion Multivitamin Gummies   Yes [provider]   pantoprazole (PROTONIX) 40 MG tablet Take 40 mg by mouth in the morning. 10/30/19  Yes [provider]  Potassium 99 MG TABS Take 99 mg by mouth daily at 4 PM.   Yes [provider]  senna (SENOKOT) 8.6 MG tablet Take 4 tablets by mouth at bedtime.   Yes [provider]  traZODone (DESYREL) 100 MG tablet Take 25 mg by mouth at bedtime as needed for sleep. 07/24/15  Yes [provider]     Positive ROS: All other systems have been reviewed and were otherwise negative with the exception of those mentioned in the HPI and as above.  Physical Exam: General: Alert, no acute distress Cardiovascular: No pedal edema Respiratory: No cyanosis, no use of accessory musculature GI: No organomegaly, abdomen is soft and non-tender Skin: No lesions in the area of chief complaint Neurologic: Sensation intact distally Psychiatric: Patient is competent for consent with normal mood and affect Lymphatic: No axillary or cervical lymphadenopathy  MUSCULOSKELETAL: Stooped gait, bilateral EHL weakness and Foot DF weakness 4/5   Assessment: L4-5 spondylolisthesis above L5-S1 anterior fusion with degenerative disc disease  Plan: Plan for Procedure(s): LEFT L4-5 TRANSFORAMINAL LUMBAR INTERBODY FUSION WITH PEDICLE SCREWS, RODS AND ADJUSTABLE CAGE, LOCAL BONE GRAFT, ALLOGRAFT BONE GRAFT  The risks benefits and alternatives were discussed with the patient including but not limited to the risks of nonoperative treatment, versus surgical intervention including infection, bleeding, nerve injury,  blood clots, cardiopulmonary complications, morbidity, mortality, among others, and they were willing to proceed.   Vira Browns, MD Cell [phone] Office [phone] 12/27/2021 7:46 AM

## 2021-12-27 NOTE — Brief Op Note (Signed)
12/27/2021  11:48 AM  PATIENT:  Meghan Gallegos  76 y.o. female  PRE-OPERATIVE DIAGNOSIS:  L4-5 spondylolisthesis above L5-S1 anterior fusion with degenerative disc disease  POST-OPERATIVE DIAGNOSIS:  L4-5 spondylolisthesis above L5-S1 anterior fusion with degenerative disc disease  PROCEDURE:  Procedure(s): LEFT LUMBAR FOUR- FIVE TRANSFORAMINAL LUMBAR INTERBODY FUSION WITH PEDICLE SCREWS, RODS AND ADJUSTABLE CAGE, LOCAL BONE GRAFT, ALLOGRAFT BONE GRAFT (N/A)  SURGEON:  Surgeon(s) and Role:    * Jessy Oto, MD - Primary  PHYSICIAN ASSISTANT: Benjiman Core, PA-C  ANESTHESIA:   local and general  EBL:  200 mL   BLOOD ADMINISTERED: 85 CC CELLSAVER  DRAINS: Urinary Catheter (Foley)   LOCAL MEDICATIONS USED:  MARCAINE 0.5% 1:1 EXPAREL 1.3% Amount: 20 ml  SPECIMEN:  No Specimen  DISPOSITION OF SPECIMEN:  N/A  COUNTS:  YES  TOURNIQUET:  * No tourniquets in log *  DICTATION: .Dragon Dictation  PLAN OF CARE: Admit for overnight observation  PATIENT DISPOSITION:  PACU - hemodynamically stable.   Delay start of Pharmacological VTE agent (>24hrs) due to surgical blood loss or risk of bleeding: yes

## 2021-12-27 NOTE — Op Note (Addendum)
12/27/2021  12:03 PM  PATIENT:  Meghan Gallegos  76 y.o. female  MRN: [id number]  OPERATIVE REPORT  PRE-OPERATIVE DIAGNOSIS:  L4-5 spondylolisthesis above L5-S1 anterior fusion with degenerative disc disease  POST-OPERATIVE DIAGNOSIS:  L4-5 spondylolisthesis above L5-S1 anterior fusion with degenerative disc disease  PROCEDURE:  Procedure(s): LEFT LUMBAR FOUR- FIVE TRANSFORAMINAL LUMBAR INTERBODY FUSION WITH PEDICLE SCREWS, RODS AND ADJUSTABLE CAGE, LOCAL BONE GRAFT, ALLOGRAFT BONE GRAFT    SURGEON:  Jessy Oto, MD     ASSISTANT: Benjiman Core, PA-C  (Present throughout the entire procedure and necessary for completion of procedure in a timely manner)     ANESTHESIA:  General,supplemented with local marcaine 0.5% 1:1 exparel 1.3% total 20cc, Dr.   Pleas Patricia:  300CC  CELL SAVER BLOOD RETURNED: 67YP    COMPLICATIONS:  None.     COMPONENTS:   Implant Name Type Inv. Item Serial No. Manufacturer Lot No. LRB No. Used Action  BONE [facility] CHIPS 20CC PCAN1/4 - [id number] Bone Implant BONE [facility] CHIPS [facility] PCAN1/4 [id number] Coyle  N/A 1 Implanted  KIT INFUSE SMALL - [id number] Orthopedic Implant KIT INFUSE SMALL  [employer] [facility] [id number] N/A 1 Implanted  CAGE SABLE 10X26 8D - [id number] Cage CAGE SABLE 10X26 8D  [employer] [id number] N/A 1 Implanted  SCREW MOD SD CREO 7.5X50 - [id number] Screw SCREW MOD SD CREO 7.5X50  [employer]  N/A 2 Implanted  SCREW CREO ONE ROB MOD 7.5X55 - [id number] Screw SCREW CREO ONE ROB MOD 7.5X55  [employer]  N/A 2 Implanted  CAP LOCKING THREADED - [id number] Cap CAP LOCKING THREADED  [employer]  N/A 4 Implanted  SCREW PA THRD CREO TULIP 5.5X4 - [id number] Head SCREW PA THRD CREO TULIP 5.5X4  [employer]  N/A 4 Implanted  ROD 40MM SPINAL - [id number] Rod ROD 40MM SPINAL  [employer]  N/A 1 Implanted  ROD SPINAL 35MM - [id number] Rod ROD SPINAL 35MM  [employer]  N/A 1 Implanted    PROCEDURE:The patient was met in the holding area,  and the appropriate lumbar level left L4-5  identified and marked with an x and my initials.The patient was then transported to OR. The patient was then placed under general anesthesia without difficulty.The patient received appropriate preoperative antibiotic prophylaxis ancef.  Nursing staff inserted a Foley catheter under sterile conditions. She was then turned to a prone position [place] spine table was used for this case. All pressure points were well padded PAS stocking applied bilateral lower extremity to prevent DVT. Standard prep DuraPrep solution. Draped in the usual manner. Time-out procedure was called and correct .   The incision was at L4-5 and determined using C-arm to mark the inferior L5 pedicles and  an additionally extended up to the L3 spinous process.   Bovie electric cautery was used to control bleeding and carefully dissection was carried down along the lateral aspects of the spinous process of L3 to L5. Cobb then used to carefully elevate the paralumbar muscle and the incision in the midline was carried to to the level of the base of the residual spinous processes. The posterior exposure area extending from the base of the spinous process of L3 to the superior aspect of L5 was carried expose at its edges debrided the muscle attachments using a Leskell. Time-out procedure was called and correct. Skin in the midline between L2 and L5 was then infiltrated with local anesthesia, marcaine 1/2% 1:1 exparel 1.3% total 20 cc used. Incision was then made  extending  from L2-L5  through the skin and subcutaneous layers down to the patient's lumbodorsal fascia and spinous processes. The incision then carried sharply excising the old incision scar in the midline down to  the supraspinous ligament and then continuing the lateral aspect of the spinous processes of L3,L4 and L5. Cobb elevator used to carefully elevate the paralumbar muscles off of the posterior elements using electrocautery carefully  drilled bleeding and perform dissection of the muscle tissues of the preserving the facet capsule at the L3-4. Continuing the exposure out laterally to expose the lateral margin of the facet joint line at L4-5 and L3-4. Incision was carried in the midline down to the L5 level area bleeders controlled using electrocautery monopolar electrocautery.    C-arm fluoroscopy was then brought into the field and using C-arm fluoroscopy then a hole made into the medial aspect of the pedicle of L4 using a high speed burr observed in the pedicle using C arm at the 5 oclock position on the left L4 pedicle nerve probe initial entry was determined on fluoroscopy to be good position alignment so that a 4.74m tap was passed to 55 mm within the left L4 pedicle to a depth of nearly 55 mm observed on C-arm fluoroscopy to be beyond the midpoint of the lumbar vertebra and then position alignment within the left L4 pedicle this was then removed and the pedicle channel probed demonstrating patency no sign of rupture the cortex of the pedicle. Tapping with a 4.5 mm screw tap then 5.5 mm tap then a 6.5 mm and 7.576mtap a 7.0 mm x 55 mm screw shank was placed on the left side pedicle at the L4 level. C-arm fluoroscopy was then brought into the field and using C-arm fluoroscopy then a hole made into the posterior medial aspect of the pedicle of right L4 observed in the pedicle using ball tipped nerve hook and hockey stick nerve probe initial entry was determined on fluoroscopy to be good position alignment so that 4.82m34map was then used to tap the right L4 pedicle to a depth of nearly 55 mm observed on C-arm fluoroscopy to be beyond the midpoint of the lumbar vertebra and then position alignment within the right L4 pedicle this was then removed and the pedicle channel probed demonstrating patency no sign of rupture the cortex of the pedicle. Tapping with a 5.5 mm screw tap then tapping with a 6.5 mm and 7.5 mm tap, a 7.5 mm x 55 mm screw was  placeed.C-arm fluoroscopy was then brought into the field and using C-arm fluoroscopy then a hole made into the posterior and medial aspect of the left pedicle of L5 observed in the pedicle using ball tipped nerve hook and hockey stick nerve probe initial entry was determined on fluoroscopy to be good position alignment so that a 4.5 mm tap was then used to tap the left L5 pedicle to a depth of nearly 50 mm observed on C-arm fluoroscopy to be beyond the posterior one third of the lumbar vertebra and good position alignment within the left L5 pedicle this was then removed and the pedicle channel probed demonstrating patency no sign of rupture the cortex of the pedicle. Tapping with a 4.5 mm screw tap then a 5.5 mm tap then tapping with a 6.5 mm and 7.5 mm tap a 7.5 mm x 50 mm screw shank was placed on the left side at the L5 level. The pedicle channel of L5 on the left probed demonstrating patency no sign  of rupture the cortex of the pedicle.C-arm fluoroscopy was then brought into the field and using C-arm fluoroscopy then a hole made into the posterior and medial aspect of the right pedicle of L5 observed in the pedicle using ball tipped nerve hook and hockey stick nerve probe initial entry was determined on fluoroscopy to be good position alignment so that a 4.42m tap was then used to tap the right L5 pedicle to a depth of nearly 50 mm observed on C-arm fluoroscopy to be beyond the posterior one third of the lumbar vertebra and good position alignment within the right L5 pedicle this was then removed and the pedicle channel probed demonstrating patency no sign of rupture the cortex of the pedicle. Tapping with a 4.528mscrew tap then up to a 7.85m16map then 7.85mm52m50 mm screw was placed on the right side at the L5 level. The pedicle channel of L5 on the right probed demonstrating patency no sign of rupture the cortex of the pedicle.Corticortrajectory screw for fixation of this level was measured as 7.5 mm x 50 mm  screw inserted.   Spinous processes of  L4 inferior 50%was then resected down to the base the lamina at the L4 segment.  Leksell rongeur used to resect inferior aspect of the lamina on the left side at the L4 level and partially on the right side at L4 The left medial 40% of the facets of L4-5 were resected in order to decompress the left and right side of the lumbar thecal sac at L4-5 and decompress the bilateral  L4 and L5 neuroforamen. Osteotomes and 2mm 585m 3mm k73misons were used for this portion of the decompression. Similarly the right side decompression was carried out but  Near complete facetectomy was perform on the left at L4-5 to provide for exposure of the left side L4-5 neuroforamen for ease of placement of TLIF (transforaminal lumbar interbody fusion) at the L4 level inferior portions of the lamina and pars were also resected first beginning with the Leksell rongeur and osteotomes and then resecting using 2 and 3 mm Kerrison. Continued laminectomy was carried out resecting the central portions of the lamina of L4 and upper L5 performing foraminotomies on the right side at the L4 and L5 levels. The inferior articular process  L4 was resected on the right side.  A large amount of hypertrophic ligmentum flavum was found impressing on the right lateral recesses at L4-5 and narrowing the respective L4 and L5 neuroforamen.  Loupe magnification and headlight were used during this portion procedure. Then the operating room microscope sterilely draped and brought into the field.  Attention then turned to placement of the transforaminal lumbar interbody fusion cage.  Bleeding controlled using bipolar electrocautery thrombin soaked gel cottonoids. Then turned to the left L4-5 level the exposure the posterior lateral aspect this was carried out using a Penfield 4 bipolar electrocautery to control small bleeders present. Derricho retractor used to retract the thecal sac and L4 nerve root a 15 blade scalpel was  used to incise posterior lateral aspect of the left L4-5 disc the disc space at this level showed a rather severe narrowing posteriorly was more open anteriorly so that an osteotome again was used to resect a small portion the posterior superior lip of the vertebral body at L5  in order to gain ease of access into the L4-5 disc space. The space was debrided of degenerative disc material using pituitary along root the entire disc space was then debrided of degenerative  disc material using pituitary rongeurs curettage down to bleeding bone endplates. 3m and 925mshavers were used to debride the disc space and pituitary ronguers used to remove the loosened debris. This space was then carefully assess using spacers  a 10.101m53mrial cage provided the best fit, the GloGreencastlege 9-71m13m26mm44mh 8 degrees lordosis was chosen so that the permanent adjustable 9-71mm 54m by 26 mm Sable cage packed with local bone graft and acellular allograft bone graft were placed into the intervertebral disc space. The posterior intervertebral disc space was then packed with autogenous local bone graft that been harvested from the central laminectomy a small pak of Infuse BMP, allograft cancellous acellular bone. 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 Bleeding controlled using bipolar electrocautery.  Observed on C-arm fluoroscopy to be in good position alignment. The cage at L4-5 was placed anteriorly as best as possible the correct patient's lordosis. The cage was then raised with the inserted screw driver and the LIFT mechanism deployed. The cage was then filled with autograph bone graft. A layer of fibrin glue tuseal was then used to exclude the posterior opening in the disc and to close off the area of BMP from the spinal canal and left L4 neuroforamen. With this then the transforaminal lumbar interbody fusion portion of the case was completed bleeders were  controlled using bipolar electrocautery thrombin-soaked Gelfoam were appropriate.Decortication of the right facet joint carried out at L4-5. These were packed with cancellous local bone graft.  The 2 Globus corticotrajectory fixation screw tulips on the left were each placed and then each fastener carefully aligned  to allow for placement of rods. The left rod was a precontoured 40 mm rod. This was then placed into the pedicle screws on the left extending from L4-L5 each of the caps were carefully placed loosely tightened.Caps onto the left L4 fastener was tightened to 80 foot lbs. Across the right side a precontoured 35mm t51mium rod was placed into the L4 and L5  screw fasteners and the upper cap tightened to 80 foot lbs., compression was obtained on the right side between L5 and L4 compressing between the fasteners and tightening the screw caps 85 pounds.Copious amounts of saline solution this was done throughout the case. Cell Saver was used during the case. 85CC of cell saver blood returned to the patient.  Hockey stick neuroprobe was used to probe the neuroforamen bilateral L4 and L5 these were determined to be well decompressed. Permanent C-arm images were obtained in AP and lateral plane and oblique planes. Remaining local bone graft was then applied along both lateral posterior lateral region extending from right L4 to L5 facet bed.Gelfoam was then removed spinal canal. The lumbodorsal musculature carefully exam debrided of any devitalized tissue following removal of self retaining retractors were the bleeders were controlled using electrocautery and the area dorsal lumbar muscle were then approximated in the midline with interrupted #1 Vicryl sutures loose the dorsal fascia was reattached to the spinous process of L3  superiorly and L5  inferiorly this was done with #1 Vicryl sutures. Subcutaneous layers then approximated using interrupted 0 Vicryl sutures and 2-0 Vicryl sutures. Skin was closed with a  stainless steel staples    then MedPlex bandage. All instrument and sponge counts were correct. The patient was then returned to a supine position on her bed reactivated extubated and returned to the recovery room in satisfactory condition.    JamesJeneen Rinks  Ricard Dillon PA-C perform the duties of Pensions consultant during this case. He was present from the beginning of the case to the end of the case assisting in transfer the patient from his stretcher to the OR table and back to the stretcher at the end of the case. Assisted in careful retraction and suction of the laminectomy site delicate neural structures operating under the operating room microscope. He performed closure of the incision from the fascia to the skin applying the dressing.     ME@ 12/27/2021,12:03 PM

## 2021-12-27 NOTE — Anesthesia Postprocedure Evaluation (Signed)
Anesthesia Post Note  Patient: Meghan Gallegos  Procedure(s) Performed: LEFT LUMBAR FOUR- FIVE TRANSFORAMINAL LUMBAR INTERBODY FUSION WITH PEDICLE SCREWS, RODS AND ADJUSTABLE CAGE, LOCAL BONE GRAFT, ALLOGRAFT BONE GRAFT (Spine Lumbar)     Patient location during evaluation: PACU Anesthesia Type: General Level of consciousness: awake and alert, oriented and patient cooperative Pain management: pain level controlled Vital Signs Assessment: post-procedure vital signs reviewed and stable Respiratory status: spontaneous breathing, nonlabored ventilation and respiratory function stable Cardiovascular status: blood pressure returned to baseline and stable Postop Assessment: no apparent nausea or vomiting Anesthetic complications: no   No notable events documented.  Last Vitals:  Vitals:   12/27/21 1230 12/27/21 1245  BP: 115/86 116/73  Pulse: 77 78  Resp: 12 15  Temp:    SpO2: 99% 95%    Last Pain:  Vitals:   12/27/21 1245  TempSrc:   PainSc: 7                  Lannie Fields

## 2021-12-27 NOTE — Discharge Instructions (Signed)

## 2021-12-28 DIAGNOSIS — M48062 Spinal stenosis, lumbar region with neurogenic claudication: Secondary | ICD-10-CM | POA: Diagnosis not present

## 2021-12-28 LAB — CBC
HCT: 32.7 % — ABNORMAL LOW (ref 36.0–46.0)
Hemoglobin: 10.4 g/dL — ABNORMAL LOW (ref 12.0–15.0)
MCH: 27 pg (ref 26.0–34.0)
MCHC: 31.8 g/dL (ref 30.0–36.0)
MCV: 84.9 fL (ref 80.0–100.0)
Platelets: 167 10*3/uL (ref 150–400)
RBC: 3.85 MIL/uL — ABNORMAL LOW (ref 3.87–5.11)
RDW: 13 % (ref 11.5–15.5)
WBC: 11.4 10*3/uL — ABNORMAL HIGH (ref 4.0–10.5)
nRBC: 0 % (ref 0.0–0.2)

## 2021-12-28 LAB — BASIC METABOLIC PANEL
Anion gap: 4 — ABNORMAL LOW (ref 5–15)
BUN: 8 mg/dL (ref 8–23)
CO2: 30 mmol/L (ref 22–32)
Calcium: 8.1 mg/dL — ABNORMAL LOW (ref 8.9–10.3)
Chloride: 107 mmol/L (ref 98–111)
Creatinine, Ser: 0.66 mg/dL (ref 0.44–1.00)
GFR, Estimated: >60 mL/min (ref >60)
Glucose, Bld: 120 mg/dL — ABNORMAL HIGH (ref 70–99)
Potassium: 4.2 mmol/L (ref 3.5–5.1)
Sodium: 141 mmol/L (ref 135–145)

## 2021-12-28 MED ORDER — FERROUS GLUCONATE 324 (38 FE) MG PO TABS: 324.0000 mg | ORAL_TABLET | Freq: Two times a day (BID) | ORAL | 0 refills | Status: DC

## 2021-12-28 MED ORDER — FERROUS GLUCONATE 324 (38 FE) MG PO TABS
324.0000 mg | ORAL_TABLET | Freq: Two times a day (BID) | ORAL | Status: DC
  Filled 2021-12-28 (×2): qty 1

## 2021-12-28 MED ORDER — METHOCARBAMOL 500 MG PO TABS
500.0000 mg | ORAL_TABLET | Freq: Three times a day (TID) | ORAL | 1 refills | Status: DC | PRN
Start: 2021-12-28 — End: 2022-05-16

## 2021-12-28 MED ORDER — MORPHINE SULFATE ER 60 MG PO TBCR: 60.0000 mg | EXTENDED_RELEASE_TABLET | Freq: Two times a day (BID) | ORAL | 0 refills | Status: DC

## 2021-12-28 MED ORDER — OXYCODONE HCL ER 15 MG PO T12A
40.0000 mg | EXTENDED_RELEASE_TABLET | Freq: Two times a day (BID) | ORAL | Status: DC
  Administered 2021-12-28: 40 mg via ORAL
  Filled 2021-12-28: qty 1

## 2021-12-28 MED ORDER — OXYCODONE HCL 10 MG PO TABS: 10.0000 mg | ORAL_TABLET | ORAL | 0 refills | Status: DC | PRN

## 2021-12-28 MED FILL — Thrombin For Soln Kit 20000 Unit: CUTANEOUS | Qty: 1 | Status: AC

## 2021-12-28 NOTE — Evaluation (Signed)
Physical Therapy Evaluation Patient Details Name: Meghan Gallegos MRN: [id number] DOB: 04/16/1946 Today's Date: 12/28/2021  History of Present Illness  Meghan Gallegos  76 y.o. female who underwent L L5-6 transforaminal lumbar interbody fusion 5/22.  PMHx: HTN, hyperthyroidism, multiple back/cervial surgeries  Clinical Impression  Pt admitted with above diagnosis. Pt was able to ambulate and incr distance and also went up and down steps with supervision. Pt states she plans to have sister come stay with her. Pt educated on all aspects of handout regarding precautions as well as on all aspects of mobility.  Will most likely go home. Recommend HHPT and HHOT for home assessment as pt may need some tub equipment for home and would benefit from PT to incr endurance.  Pt currently with functional limitations due to the deficits listed below (see PT Problem List). Pt will benefit from skilled PT to increase their independence and safety with mobility to allow discharge to the venue listed below.          Recommendations for follow up therapy are one component of a multi-disciplinary discharge planning process, led by the attending physician.  Recommendations may be updated based on patient status, additional functional criteria and insurance authorization.  Follow Up Recommendations Home health PT, HHOT    Assistance Recommended at Discharge Set up Supervision/Assistance  Patient can return home with the following  A little help with walking and/or transfers;A little help with bathing/dressing/bathroom    Equipment Recommendations Rollator (4 wheels) (long handled bath sponge most likely), ? Tub equipment with HHOT assessment  Recommendations for Other Services       Functional Status Assessment Patient has had a recent decline in their functional status and demonstrates the ability to make significant improvements in function in a reasonable and predictable amount of time.     Precautions / Restrictions  Precautions Precautions: Fall;Back Precaution Booklet Issued: No Precaution Comments: verbally reviewed Required Braces or Orthoses: Spinal Brace Spinal Brace: Lumbar corset;Applied in sitting position Restrictions Weight Bearing Restrictions: No      Mobility  Bed Mobility Overal bed mobility: Needs Assistance Bed Mobility: Rolling, Sidelying to Sit Rolling: Supervision Sidelying to sit: Supervision       General bed mobility comments: reviewed log roll with pt needing cues for technique    Transfers Overall transfer level: Needs assistance Equipment used: Rolling walker (2 wheels) Transfers: Sit to/from Stand Sit to Stand: Supervision           General transfer comment: cues for hand placement with good carryover    Ambulation/Gait Ambulation/Gait assistance: Supervision Gait Distance (Feet): 380 Feet Assistive device: Rollator (4 wheels) Gait Pattern/deviations: Step-through pattern, Decreased stride length   Gait velocity interpretation: <1.8 ft/sec, indicate of risk for recurrent falls   General Gait Details: Pt with no LOB with rollator. Did well overall and is safe with locking and unlocking brakes to sit and rest.  Stairs Stairs: Yes Stairs assistance: Supervision Stair Management: Two rails, Step to pattern, Forwards Number of Stairs: 3 General stair comments: Pt had no difficulty on steps  Wheelchair Mobility    Modified Rankin (Stroke Patients Only)       Balance Overall balance assessment: Mild deficits observed, not formally tested                                           Pertinent Vitals/Pain Pain Assessment Pain Assessment: Faces  Faces Pain Scale: Hurts little more Pain Location: back Pain Descriptors / Indicators: Discomfort Pain Intervention(s): Limited activity within patient's tolerance, Monitored during session, Repositioned    Home Living Family/patient expects to be discharged to:: Private  residence Living Arrangements: Spouse/significant other Available Help at Discharge: Family;Available PRN/intermittently Type of Home: House Home Access: Stairs to enter Entrance Stairs-Rails: None Entrance Stairs-Number of Steps: 3   Home Layout: One level Home Equipment: Cane - single point      Prior Function Prior Level of Function : Independent/Modified Independent;Driving                     Hand Dominance   Dominant Hand: Right    Extremity/Trunk Assessment   Upper Extremity Assessment Upper Extremity Assessment: Defer to OT evaluation    Lower Extremity Assessment Lower Extremity Assessment: Generalized weakness    Cervical / Trunk Assessment Cervical / Trunk Assessment: Back Surgery  Communication   Communication: No difficulties  Cognition Arousal/Alertness: Awake/alert Behavior During Therapy: WFL for tasks assessed/performed Overall Cognitive Status: Within Functional Limits for tasks assessed                                          General Comments General comments (skin integrity, edema, etc.): VSS.    Exercises     Assessment/Plan    PT Assessment Patient needs continued PT services  PT Problem List Decreased activity tolerance;Decreased balance;Decreased mobility;Decreased knowledge of use of DME;Decreased safety awareness;Decreased knowledge of precautions;Pain       PT Treatment Interventions Stair training;Gait training;DME instruction;Functional mobility training;Therapeutic activities;Therapeutic exercise;Balance training;Patient/family education    PT Goals (Current goals can be found in the Care Plan section)  Acute Rehab PT Goals Patient Stated Goal: to go home PT Goal Formulation: With patient Time For Goal Achievement: 01/11/22 Potential to Achieve Goals: Good    Frequency Min 5X/week     Co-evaluation               AM-PAC PT "6 Clicks" Mobility  Outcome Measure Help needed turning from your  back to your side while in a flat bed without using bedrails?: A Little Help needed moving from lying on your back to sitting on the side of a flat bed without using bedrails?: A Little Help needed moving to and from a bed to a chair (including a wheelchair)?: A Little Help needed standing up from a chair using your arms (e.g., wheelchair or bedside chair)?: A Little Help needed to walk in hospital room?: A Little Help needed climbing 3-5 steps with a railing? : A Little 6 Click Score: 18    End of Session Equipment Utilized During Treatment: Gait belt;Back brace Activity Tolerance: Patient tolerated treatment well Patient left: with call bell/phone within reach;in bed;with bed alarm set Nurse Communication: Mobility status PT Visit Diagnosis: Muscle weakness (generalized) (M62.81);Pain Pain - part of body:  (back)    Time: 2841-3244 PT Time Calculation (min) (ACUTE ONLY): 36 min   Charges:   PT Evaluation $PT Eval Moderate Complexity: 1 Mod PT Treatments $Gait Training: 8-22 mins        Jameia Makris M,PT Acute Rehab Services [phone] (pager)   Bevelyn Buckles 12/28/2021, 12:17 PM

## 2021-12-28 NOTE — Progress Notes (Signed)
     Subjective: 1 Day Post-Op Procedure(s) (LRB): LEFT LUMBAR FOUR- FIVE TRANSFORAMINAL LUMBAR INTERBODY FUSION WITH PEDICLE SCREWS, RODS AND ADJUSTABLE CAGE, LOCAL BONE GRAFT, ALLOGRAFT BONE GRAFT (N/A) Awake, alert and oriented x 4. Changed from Oxycontin 20mg  to 40mg  SR form and this is much better tolerated. She is not somulent or obtunded but alert on this medication indicating A fair degree of opioid tolerance. She is comfortable with going home would like to wait until her next dose of pain med is given before discharge. [facility] near her home is apparently Closed so will use [facility] on [address] for discharge meds.   Patient reports pain as mild.    Objective:   VITALS:  Temp:  [98 F (36.7 C)-98.8 F (37.1 C)] 98.8 F (37.1 C) (05/23 1124) Pulse Rate:  [69-84] 75 (05/23 1124) Resp:  [14-19] 15 (05/23 1124) BP: (105-133)/(50-77) 105/52 (05/23 1124) SpO2:  [94 %-100 %] 94 % (05/23 1124)  Neurologically intact ABD soft Neurovascular intact Sensation intact distally Intact pulses distally Dorsiflexion/Plantar flexion intact Incision: dressing C/D/I, no drainage, and dressing changed to a opsite waffle dressing.  No cellulitis present   LABS Recent Labs    12/28/21 0350  HGB 10.4*  WBC 11.4*  PLT 167   Recent Labs    12/28/21 0350  NA 141  K 4.2  CL 107  CO2 30  BUN 8  CREATININE 0.66  GLUCOSE 120*   No results for input(s): LABPT, INR in the last 72 hours.   Assessment/Plan: 1 Day Post-Op Procedure(s) (LRB): LEFT LUMBAR FOUR- FIVE TRANSFORAMINAL LUMBAR INTERBODY FUSION WITH PEDICLE SCREWS, RODS AND ADJUSTABLE CAGE, LOCAL BONE GRAFT, ALLOGRAFT BONE GRAFT (N/A) Minimal anemia due to expected blood loss from surgery.   Advance diet Up with therapy D/C IV fluids Discharge home   12/30/21 12/28/2021, 1:44 PM Patient ID: Meghan Gallegos, female   DOB: [birth year], 76 y.o.   MRN: 05/04/1946

## 2021-12-28 NOTE — Evaluation (Signed)
Occupational Therapy Evaluation Patient Details Name: Meghan Gallegos MRN: [id number] DOB: July 27, 1946 Today's Date: 12/28/2021   History of Present Illness Meghan Gallegos  76 y.o. female who underwent L L5-6 transforaminal lumbar interbody fusion 5/22.  PMHx: HTN, hyperthyroidism, multiple back/cervial surgeries   Clinical Impression   Matea was evaluated s/p the above back surgery, she is generally indep at baseline but has been sedentary the past few weeks due to pain. She lives in a 1 level home, 3 STE with her husband who works during the week. Upon evaluation pt verbalized great understanding of back precautions and demonstrated good carryover during mobility and ADLs. She was able to complete bed mobility and ambulation with RW given supervision A. Due to pain and precautions, she needed up to min A for LB ADLs. Pt will benefit from continued OT. Recommend d/c home with 24/7 assist from family initially.      Recommendations for follow up therapy are one component of a multi-disciplinary discharge planning process, led by the attending physician.  Recommendations may be updated based on patient status, additional functional criteria and insurance authorization.   Follow Up Recommendations  No OT follow up    Assistance Recommended at Discharge Frequent or constant Supervision/Assistance  Patient can return home with the following A little help with walking and/or transfers;A little help with bathing/dressing/bathroom;Assistance with cooking/housework;Assist for transportation;Help with stairs or ramp for entrance    Functional Status Assessment  Patient has had a recent decline in their functional status and demonstrates the ability to make significant improvements in function in a reasonable and predictable amount of time.   Equipment Recommendations  Tub/shower seat    Recommendations for Other Services       Precautions / Restrictions Precautions Precautions: Fall;Back Precaution  Booklet Issued: No Precaution Comments: verbally reviewed Required Braces or Orthoses: Spinal Brace Spinal Brace: Lumbar corset;Applied in sitting position Restrictions Weight Bearing Restrictions: No      Mobility Bed Mobility Overal bed mobility: Needs Assistance Bed Mobility: Rolling, Sidelying to Sit Rolling: Supervision Sidelying to sit: Supervision       General bed mobility comments: reviewed log roll    Transfers Overall transfer level: Needs assistance Equipment used: Rolling walker (2 wheels) Transfers: Sit to/from Stand Sit to Stand: Supervision           General transfer comment: cues for hand placement with good carryover      Balance Overall balance assessment: Mild deficits observed, not formally tested                                         ADL either performed or assessed with clinical judgement   ADL Overall ADL's : Needs assistance/impaired Eating/Feeding: Independent;Sitting   Grooming: Supervision/safety;Cueing for compensatory techniques;Standing   Upper Body Bathing: Set up;Sitting   Lower Body Bathing: Minimal assistance;Sit to/from stand   Upper Body Dressing : Set up;Sitting   Lower Body Dressing: Minimal assistance;Sit to/from stand   Toilet Transfer: Supervision/safety;Ambulation;Rolling walker (2 wheels)   Toileting- Clothing Manipulation and Hygiene: Supervision/safety;Sitting/lateral lean       Functional mobility during ADLs: Supervision/safety;Rolling walker (2 wheels);Cueing for safety;Cueing for sequencing General ADL Comments: cues for back precautions with good carryover. review of compensatory techniuqes for LB ADLs     Vision Baseline Vision/History: 0 No visual deficits Ability to See in Adequate Light: 0 Adequate Vision Assessment?: No apparent visual deficits  Perception     Praxis      Pertinent Vitals/Pain Pain Assessment Pain Assessment: Faces Faces Pain Scale: Hurts little  more Pain Location: back Pain Descriptors / Indicators: Discomfort Pain Intervention(s): Limited activity within patient's tolerance, Monitored during session     Hand Dominance Right   Extremity/Trunk Assessment Upper Extremity Assessment Upper Extremity Assessment: Generalized weakness   Lower Extremity Assessment Lower Extremity Assessment: Defer to PT evaluation   Cervical / Trunk Assessment Cervical / Trunk Assessment: Back Surgery   Communication Communication Communication: No difficulties   Cognition Arousal/Alertness: Awake/alert Behavior During Therapy: WFL for tasks assessed/performed Overall Cognitive Status: Within Functional Limits for tasks assessed                                       General Comments  VSS on, pt verbalized concern for pain management    Exercises     Shoulder Instructions      Home Living Family/patient expects to be discharged to:: Private residence Living Arrangements: Spouse/significant other Available Help at Discharge: Family;Available PRN/intermittently Type of Home: House Home Access: Stairs to enter [employer] of Steps: 3 Entrance Stairs-Rails: None Home Layout: One level     Bathroom Shower/Tub: Tub/shower unit (3 steps to get into tub shower)   Bathroom Toilet: Standard     Home Equipment: Cane - single point          Prior Functioning/Environment Prior Level of Function : Independent/Modified Independent;Driving                        OT Problem List: Decreased strength;Decreased range of motion;Decreased activity tolerance;Impaired balance (sitting and/or standing);Decreased safety awareness;Decreased knowledge of precautions;Pain      OT Treatment/Interventions: Self-care/ADL training;DME and/or AE instruction;Balance training;Patient/family education;Therapeutic activities    OT Goals(Current goals can be found in the care plan section) Acute Rehab OT Goals Patient  Stated Goal: less pain OT Goal Formulation: With patient Time For Goal Achievement: 01/11/22 Potential to Achieve Goals: Good ADL Goals Pt Will Perform Lower Body Dressing: with set-up;sit to/from stand Pt Will Transfer to Toilet: with modified independence;ambulating;regular height toilet Additional ADL Goal #1: Pt will demonstrate independent ability to maintain back precautions 100% of session  OT Frequency: Min 2X/week    Co-evaluation              AM-PAC OT "6 Clicks" Daily Activity     Outcome Measure Help from another person eating meals?: None Help from another person taking care of personal grooming?: A Little Help from another person toileting, which includes using toliet, bedpan, or urinal?: A Little Help from another person bathing (including washing, rinsing, drying)?: A Little Help from another person to put on and taking off regular upper body clothing?: None Help from another person to put on and taking off regular lower body clothing?: A Little 6 Click Score: 20   End of Session Equipment Utilized During Treatment: Rolling walker (2 wheels);Back brace Nurse Communication: Mobility status  Activity Tolerance: Patient tolerated treatment well Patient left: in chair;with call bell/phone within reach  OT Visit Diagnosis: Other abnormalities of gait and mobility (R26.89);Muscle weakness (generalized) (M62.81);Pain                Time: 0822-0850 OT Time Calculation (min): 28 min Charges:  OT General Charges $OT Visit: 1 Visit OT Evaluation $OT Eval Moderate Complexity: 1 Mod  OT Treatments $Self Care/Home Management : 8-22 mins   Amilia Vandenbrink A Allyna Pittsley 12/28/2021, 9:06 AM

## 2021-12-28 NOTE — TOC Progression Note (Signed)
Transition of Care [facility]) - Progression Note    Patient Details  Name: Meghan Gallegos MRN: [id number] Date of Birth: October 03, 1945  Transition of Care [facility]) CM/SW [place], RN Phone Number:[phone]  12/28/2021, 3:33 PM  Clinical Narrative:    DME rollintg walker has been ordered per [employer]. To be delivered to the room         Expected Discharge Plan and Services           Expected Discharge Date: 12/28/21                                     Social Determinants of Health (SDOH) Interventions    Readmission Risk Interventions     View : No data to display.

## 2021-12-28 NOTE — Plan of Care (Signed)

## 2021-12-28 NOTE — Care Management Obs Status (Signed)
MEDICARE OBSERVATION STATUS NOTIFICATION   Patient Details  Name: Meghan Gallegos MRN: [id number] Date of Birth: [birth year]   Medicare Observation Status Notification Given:  Yes    Beckie Busing, RN 12/28/2021, 4:41 PM

## 2021-12-28 NOTE — Progress Notes (Signed)
     Subjective: 1 Day Post-Op Procedure(s) (LRB): LEFT LUMBAR FOUR- FIVE TRANSFORAMINAL LUMBAR INTERBODY FUSION WITH PEDICLE SCREWS, RODS AND ADJUSTABLE CAGE, LOCAL BONE GRAFT, ALLOGRAFT BONE GRAFT (N/A) Awake, alert and oriented x 4. Is out of bed on her own and checking her clothing bag. Foley discontinued and voided well. Almost 100% of conversation is concerning pain. I explained that she is receiving quite a bit of narcotics at least 3 x what she is use to receiving. The amount of narcotics she was taking prior to surgery is impacting her pain control and the amount of meds is in excess of the normal one level TLIF. PT today, will increase baseline Oxycontin 12 hour release medication.   Patient reports pain as marked.    Objective:   VITALS:  Temp:  [98.4 F (36.9 C)-98.9 F (37.2 C)] 98.8 F (37.1 C) (05/23 0358) Pulse Rate:  [69-80] 69 (05/23 0358) Resp:  [12-19] 18 (05/23 0358) BP: (110-133)/(50-86) 110/67 (05/23 0358) SpO2:  [93 %-100 %] 100 % (05/23 0358) FiO2 (%):  [28 %] 28 % (05/22 1156)  Neurologically intact ABD soft Neurovascular intact Sensation intact distally Intact pulses distally Dorsiflexion/Plantar flexion intact Incision: dressing C/D/I and no drainage   LABS Recent Labs    12/28/21 0350  HGB 10.4*  WBC 11.4*  PLT 167   Recent Labs    12/28/21 0350  NA 141  K 4.2  CL 107  CO2 30  BUN 8  CREATININE 0.66  GLUCOSE 120*   No results for input(s): LABPT, INR in the last 72 hours.   Assessment/Plan: 1 Day Post-Op Procedure(s) (LRB): LEFT LUMBAR FOUR- FIVE TRANSFORAMINAL LUMBAR INTERBODY FUSION WITH PEDICLE SCREWS, RODS AND ADJUSTABLE CAGE, LOCAL BONE GRAFT, ALLOGRAFT BONE GRAFT (N/A) Narcotic tolerant (opioid) Advance diet Up with therapy D/C IV fluids Adjust 12 hour baseline narcotics and reassess at noon to determine if she can go home.  Vira Browns 12/28/2021, 7:34 AM Patient ID: Meghan Gallegos, female   DOB: 04/29/1946, 76 y.o.   MRN:  [id number]

## 2021-12-28 NOTE — TOC Transition Note (Signed)
Transition of Care [facility]) - CM/SW Discharge Note   Patient Details  Name: Meghan Gallegos MRN: [id number] Date of Birth: [birth year]  Transition of Care [facility]) CM/SW Contact:  Beckie Busing, RN Phone Number:[phone]  12/28/2021, 4:22 PM   Clinical Narrative:    Patient discharging home with home health recommendations. CM at bedside to offer choice for home health. Spouse at bedside states patient is in too much pain. Patient states that she is in too much pain. Both do not want to discuss home health choice. Spouse states "its not you, I just want to get her out of here." Patient states she is having too much pain to be able to do home health. No home health has been arranged. Primary nurse has been made aware.          Patient Goals and CMS Choice        Discharge Placement                       Discharge Plan and Services                                     Social Determinants of Health (SDOH) Interventions     Readmission Risk Interventions     View : No data to display.

## 2021-12-28 NOTE — Progress Notes (Signed)
2 wheel walker delivered to room, pt refused since she had worked with a 4 wheel walker with therapy. Per therapy note, they recommend a rollator (4 wheel walker), bath sponge, and tub/shower bench. Pt and her husband would now like the equipment delivered to their house so that she can proceed with discharge tonight.   Justice Rocher, RN

## 2021-12-28 NOTE — Telephone Encounter (Signed)
Dr. Otelia Sergeant spoke with the patient and hospital about patient

## 2021-12-29 DIAGNOSIS — M4316 Spondylolisthesis, lumbar region: Secondary | ICD-10-CM

## 2021-12-29 DIAGNOSIS — M48062 Spinal stenosis, lumbar region with neurogenic claudication: Secondary | ICD-10-CM

## 2021-12-29 MED FILL — Sodium Chloride IV Soln 0.9%: INTRAVENOUS | Qty: 2000 | Status: AC

## 2021-12-29 MED FILL — Heparin Sodium (Porcine) Inj 1000 Unit/ML: INTRAMUSCULAR | Qty: 30 | Status: AC

## 2021-12-29 NOTE — TOC Progression Note (Signed)
Transition of Care [facility]) - Progression Note    Patient Details  Name: Claretta Kendra MRN: [id number] Date of Birth: 1945-10-29  Transition of Care [facility]) CM/SW Contact  Beckie Busing, RN Phone Number:[phone]  12/29/2021, 8:11 AM  Clinical Narrative:    CM made aware that patient ha discharged and refused 2 wheel rolling walker that was delivered per [employer]. Original DMe orders entered per Dr. Otelia Sergeant. Orders can not be modified after patient has been discharged for greater that 2 hours. CM has attempted to call [employer] to see if we can change to 4 wheel rolling walker. No answer with [employer]. Message has been left.         Expected Discharge Plan and Services           Expected Discharge Date: 12/28/21                                     Social Determinants of Health (SDOH) Interventions    Readmission Risk Interventions     View : No data to display.

## 2021-12-30 ENCOUNTER — Ambulatory Visit: Payer: Medicare HMO | Admitting: Specialist

## 2022-01-06 ENCOUNTER — Other Ambulatory Visit: Payer: Self-pay | Admitting: Specialist

## 2022-01-07 MED ORDER — MORPHINE SULFATE ER 60 MG PO TBCR: 60.0000 mg | EXTENDED_RELEASE_TABLET | Freq: Two times a day (BID) | ORAL | 0 refills | Status: DC

## 2022-01-07 MED ORDER — OXYCODONE HCL 10 MG PO TABS: 10.0000 mg | ORAL_TABLET | ORAL | 0 refills | Status: DC | PRN

## 2022-01-11 NOTE — Discharge Summary (Signed)
Patient ID: Meghan Gallegos MRN: [id number] DOB/AGE: [birth year] 75 y.o.  Admit date: 12/27/2021 Discharge date: 12/28/2021  Admission Diagnoses:  Principal Problem:   Fusion of spine of lumbar region Active Problems:   Spinal stenosis of lumbar region with neurogenic claudication   Spondylolisthesis, lumbar region   Discharge Diagnoses:  Principal Problem:   Fusion of spine of lumbar region Active Problems:   Spinal stenosis of lumbar region with neurogenic claudication   Spondylolisthesis, lumbar region  status post Procedure(s): LEFT LUMBAR FOUR- FIVE TRANSFORAMINAL LUMBAR INTERBODY FUSION WITH PEDICLE SCREWS, RODS AND ADJUSTABLE CAGE, LOCAL BONE GRAFT, ALLOGRAFT BONE GRAFT  Past Medical History:  Diagnosis Date   Hypertension    Hyperthyroidism 07/2018   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 female who was admitted 12/27/2021 for operative treatment of Fusion of spine of lumbar region. Patient failed conservative treatments (please see the history and physical for the specifics) and had severe unremitting pain that affects sleep, daily activities and work/hobbies. After pre-op clearance, the patient was taken to the operating room on 12/27/2021 and underwent  Procedure(s): LEFT LUMBAR FOUR- FIVE TRANSFORAMINAL LUMBAR INTERBODY FUSION WITH PEDICLE SCREWS, RODS AND ADJUSTABLE CAGE, LOCAL BONE GRAFT, ALLOGRAFT BONE GRAFT.    Patient was given perioperative antibiotics:  Anti-infectives (From admission, onward)    Start     Dose/Rate Route Frequency Ordered Stop   12/27/21 1600  ceFAZolin (ANCEF) IVPB 2g/100 mL premix        2 g 200 mL/hr  over 30 Minutes Intravenous Every 8 hours 12/27/21 1336 12/28/21 0200   12/27/21 0600  ceFAZolin (ANCEF) IVPB 2g/100 mL premix        2 g 200 mL/hr over 30 Minutes Intravenous On call to O.R. 12/27/21 [id number] 12/27/21 0803   12/27/21 0559  ceFAZolin (ANCEF) 2-4 GM/100ML-% IVPB       Note to Pharmacy: Patsi Sears E: cabinet override      12/27/21 0559 12/27/21 0847        Patient was given sequential compression devices and early ambulation to prevent DVT.   Patient benefited maximally from hospital stay and there were no complications. At the time of discharge, the patient was urinating/moving their bowels without difficulty, tolerating a regular diet, pain is controlled with oral pain medications and they have been cleared by PT/OT.   Recent vital signs: No data found.   Recent laboratory studies: No results for input(s): WBC, HGB, HCT, PLT, NA, K, CL, CO2, BUN, CREATININE, GLUCOSE, INR, CALCIUM in the last 72 hours.  Invalid input(s): PT, 2   Discharge Medications:   Allergies as of 12/28/2021       Reactions   Wheat Bran Other (See Comments)   Allergy testing indication        Medication List     STOP taking these medications    HYDROcodone-acetaminophen 10-325 MG tablet Commonly known as: NORCO   morphine 15 MG 12 hr tablet Commonly known as: MS CONTIN       TAKE these medications    ADULT ONE DAILY GUMMIES PO Take 2 tablets by mouth every evening. Vitafusion Multivitamin Gummies   APPLE CIDER VINEGAR PO Take 1,250 mg by mouth every evening. 625 mg/tablet  aspirin 325 MG tablet Take 325 mg by mouth daily.   atorvastatin 20 MG tablet Commonly known as: LIPITOR Take 20 mg by mouth daily in the afternoon.   baclofen 10 MG tablet Commonly known as: LIORESAL Take 10 mg by mouth daily at 6 PM. (1700)   BIOTIN PO Take 2 tablets by mouth daily. Gummies   cetirizine 10 MG tablet Commonly known as: ZYRTEC Take 10 mg by mouth in the morning.    ciclopirox 8 % solution Commonly known as: PENLAC Apply 1 application. topically at bedtime.   DULoxetine 60 MG capsule Commonly known as: CYMBALTA Take 60 mg by mouth daily at 6 PM. (1700)   ferrous gluconate 324 MG tablet Commonly known as: FERGON Take 1 tablet (324 mg total) by mouth 2 (two) times daily with a meal.   gabapentin 300 MG capsule Commonly known as: NEURONTIN Take 600 mg by mouth in the morning, at noon, in the evening, and at bedtime.   GREEN TEA PO Take 400 mg by mouth in the morning.   losartan-hydrochlorothiazide 50-12.5 MG tablet Commonly known as: HYZAAR Take 1 tablet by mouth in the morning.   meloxicam 15 MG tablet Commonly known as: MOBIC Take 1 tablet (15 mg total) by mouth daily. What changed:  when to take this additional instructions   methocarbamol 500 MG tablet Commonly known as: ROBAXIN Take 1 tablet (500 mg total) by mouth every 8 (eight) hours as needed for muscle spasms.   pantoprazole 40 MG tablet Commonly known as: PROTONIX Take 40 mg by mouth in the morning.   Potassium 99 MG Tabs Take 99 mg by mouth daily at 4 PM.   senna 8.6 MG tablet Commonly known as: SENOKOT Take 4 tablets by mouth at bedtime.   traZODone 100 MG tablet Commonly known as: DESYREL Take 25 mg by mouth at bedtime as needed for sleep.   Vitamin D-3 125 MCG (5000 UT) Tabs Take 5,000 Units by mouth every evening.        Diagnostic Studies: DG Lumbar Spine Complete  Result Date: 12/27/2021 CLINICAL DATA:  Intraoperative imaging of lumbar spine fixation EXAM: LUMBAR SPINE - COMPLETE 4+ VIEW COMPARISON:  09/24/2021 FINDINGS: Four intraoperative images demonstrate placement of L4-5 trans pedicle screws with interbody fusion material. The L5-S1 interbody fusion material is again identified. Anterolisthesis of L4 on L5 is felt to be slightly improved. IMPRESSION: Intraoperative imaging of L4-5 fixation. Electronically Signed   By: Jeronimo Greaves M.D.   On:  12/27/2021 11:48   DG C-Arm 1-60 Min-No Report  Result Date: 12/27/2021 Fluoroscopy was utilized by the requesting physician.  No radiographic interpretation.   DG C-Arm 1-60 Min-No Report  Result Date: 12/27/2021 Fluoroscopy was utilized by the requesting physician.  No radiographic interpretation.   DG C-Arm 1-60 Min-No Report  Result Date: 12/27/2021 Fluoroscopy was utilized by the requesting physician.  No radiographic interpretation.    Discharge Instructions     Call MD / Call 911   Complete by: As directed    If you experience chest pain or shortness of breath, CALL 911 and be transported to the hospital emergency room.  If you develope a fever above 101 F, pus (white drainage) or increased drainage or redness at the wound, or calf pain, call your surgeon's office.   Constipation Prevention   Complete by: As directed    Drink plenty of fluids.  Prune juice may be helpful.  You may use a stool softener, such as Colace (over  the counter) 100 mg twice a day.  Use MiraLax (over the counter) for constipation as needed.   Diet - low sodium heart healthy   Complete by: As directed    Discharge instructions   Complete by: As directed    Call if there is increasing drainage, fever greater than 101.5, severe head aches, and worsening nausea or light sensitivity. If shortness of breath, bloody cough or chest tightness or pain go to an emergency room. No lifting greater than 10 lbs. Avoid bending, stooping and twisting. Use brace when sitting and out of bed even to go to bathroom. Walk in house for first 2 weeks then may start to get out slowly increasing distances up to one mile by 4-6 weeks post op. After 5 days may shower and change dressing following bathing with shower.When bathing remove the brace shower and replace brace before getting out of the shower. If drainage, keep dry dressing and do not bathe the incision, use an moisture impervious dressing. Please call and return for  scheduled follow up appointment 2 weeks from the time of surgery.   Driving restrictions   Complete by: As directed    No driving for 8 weeks   Increase activity slowly as tolerated   Complete by: As directed    Lifting restrictions   Complete by: As directed    No lifting for 12 weeks   Post-operative opioid taper instructions:   Complete by: As directed    POST-OPERATIVE OPIOID TAPER INSTRUCTIONS: It is important to wean off of your opioid medication as soon as possible. If you do not need pain medication after your surgery it is ok to stop day one. Opioids include: Codeine, Hydrocodone(Norco, Vicodin), Oxycodone(Percocet, oxycontin) and hydromorphone amongst others.  Long term and even short term use of opiods can cause: Increased pain response Dependence Constipation Depression Respiratory depression And more.  Withdrawal symptoms can include Flu like symptoms Nausea, vomiting And more Techniques to manage these symptoms Hydrate well Eat regular healthy meals Stay active Use relaxation techniques(deep breathing, meditating, yoga) Do Not substitute Alcohol to help with tapering If you have been on opioids for less than two weeks and do not have pain than it is ok to stop all together.  Plan to wean off of opioids This plan should start within one week post op of your joint replacement. Maintain the same interval or time between taking each dose and first decrease the dose.  Cut the total daily intake of opioids by one tablet each day Next start to increase the time between doses. The last dose that should be eliminated is the evening dose.           Follow-up Information     Kerrin ChampagneNitka, Garison Genova E, MD Follow up in 2 week(s).   Specialty: Orthopedic Surgery Why: For wound re-check Contact information: [address] [place] [address] [phone]                 Discharge Plan:  discharge to home  Disposition:     Signed: Zonia KiefJames Aoife Bold  01/11/2022,  9:36 AM

## 2022-01-13 ENCOUNTER — Ambulatory Visit (INDEPENDENT_AMBULATORY_CARE_PROVIDER_SITE_OTHER): Payer: Medicare HMO | Admitting: Specialist

## 2022-01-13 ENCOUNTER — Encounter: Payer: Self-pay | Admitting: Specialist

## 2022-01-13 ENCOUNTER — Ambulatory Visit (INDEPENDENT_AMBULATORY_CARE_PROVIDER_SITE_OTHER): Payer: Medicare HMO

## 2022-01-13 VITALS — BP 88/65 | HR 96 | Ht 64.5 in | Wt 203.0 lb

## 2022-01-13 DIAGNOSIS — M4316 Spondylolisthesis, lumbar region: Secondary | ICD-10-CM

## 2022-01-13 MED ORDER — MORPHINE SULFATE ER 30 MG PO TBCR: 30.0000 mg | EXTENDED_RELEASE_TABLET | Freq: Two times a day (BID) | ORAL | 0 refills | Status: DC

## 2022-01-13 MED ORDER — OXYCODONE HCL 10 MG PO TABS: 10.0000 mg | ORAL_TABLET | ORAL | 0 refills | Status: DC | PRN

## 2022-01-13 NOTE — Progress Notes (Signed)
   Post-Op Visit Note   Patient: Meghan Gallegos           Date of Birth: [birth year]           MRN: [id number] Visit Date: 01/13/2022 PCP: Audie Box, MD   Assessment & Plan: 2 weeks   Chief Complaint:  Chief Complaint  Patient presents with   Lower Back - Routine Post Op, Follow-up   Visit Diagnoses:  1. Spondylolisthesis, lumbar region   Incision is healed and staples are intact. Motor is normal  SLR is normal. Radiographs good position and alignment no lucencies.  Plan:  If shortness of breath, bloody cough or chest tightness or pain go to an emergency room. No lifting greater than 5-10 lbs. Avoid bending, stooping and twisting. Use brace when sitting and out of bed even to go to bathroom. Slowly increasing distances up to one mile by 4-6 weeks post op. Bath with shower.When bathing remove the brace shower and replace brace before getting out of the shower.  Please call and return for scheduled follow up appointment 2 weeks from the time of surgery.    Follow-Up Instructions: No follow-ups on file.   Orders:  Orders Placed This Encounter  Procedures   XR Lumbar Spine 2-3 Views   No orders of the defined types were placed in this encounter.   Imaging: No results found.  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  Past Surgical History:  Procedure Laterality Date   BACK SURGERY     CERVICAL FUSION     2011   [place]   Social History   Occupational History   Not on file  Tobacco Use   Smoking status: Never   Smokeless tobacco:  Never  Vaping Use   Vaping Use: Never used  Substance and Sexual Activity   Alcohol use: Never   Drug use: Never   Sexual activity: Not on file

## 2022-01-13 NOTE — Patient Instructions (Signed)
Plan:  If shortness of breath, bloody cough or chest tightness or pain go to an emergency room. No lifting greater than 5-10 lbs. Avoid bending, stooping and twisting. Use brace when sitting and out of bed even to go to bathroom. Slowly increasing distances up to one mile by 4-6 weeks post op. Bath with shower.When bathing remove the brace shower and replace brace before getting out of the shower.  Please call and return for scheduled follow up appointment 2 weeks from the time of surgery.

## 2022-01-26 ENCOUNTER — Other Ambulatory Visit: Payer: Self-pay | Admitting: Specialist

## 2022-01-26 MED ORDER — OXYCODONE HCL 10 MG PO TABS: 10.0000 mg | ORAL_TABLET | ORAL | 0 refills | Status: DC | PRN

## 2022-02-03 ENCOUNTER — Encounter: Payer: Self-pay | Admitting: Specialist

## 2022-02-03 ENCOUNTER — Ambulatory Visit: Payer: Medicare HMO | Admitting: Specialist

## 2022-02-03 ENCOUNTER — Ambulatory Visit (INDEPENDENT_AMBULATORY_CARE_PROVIDER_SITE_OTHER): Payer: Medicare HMO

## 2022-02-03 VITALS — BP 133/83 | HR 84 | Ht 64.5 in | Wt 203.0 lb

## 2022-02-03 DIAGNOSIS — Z981 Arthrodesis status: Secondary | ICD-10-CM

## 2022-02-03 DIAGNOSIS — M5136 Other intervertebral disc degeneration, lumbar region: Secondary | ICD-10-CM

## 2022-02-03 DIAGNOSIS — M4807 Spinal stenosis, lumbosacral region: Secondary | ICD-10-CM

## 2022-02-03 DIAGNOSIS — M4316 Spondylolisthesis, lumbar region: Secondary | ICD-10-CM

## 2022-02-03 NOTE — Progress Notes (Signed)
   Post-Op Visit Note   Patient: Meghan Gallegos           Date of Birth: 1946-07-05           MRN: [id number] Visit Date: 02/03/2022 PCP: Benjaman Pott, MD   Assessment & Plan: 5 weeks post op extension of fusion to L4-5.  Chief Complaint:  Chief Complaint  Patient presents with   Lower Back - Routine Post Op  Incision is healed Legs show good strength in tests to confrontation SLR is negative  Visit Diagnoses:  1. History of lumbar fusion     Plan:  No lifting greater than 10 lbs. Avoid bending, stooping and twisting. Use brace when sitting and out of bed even to go to bathroom. Walk in house for first 2 weeks then may start to get out slowly increasing distances up to one mile by 4-6 weeks post op.  Please call and return for scheduled follow up appointment 6 weeks from the time of surgery.    Follow-Up Instructions: No follow-ups on file.   Orders:  Orders Placed This Encounter  Procedures   XR Lumbar Spine 2-3 Views   No orders of the defined types were placed in this encounter.   Imaging: No results found.  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 No pertinent family history.  Past Surgical History:  Procedure Laterality Date   BACK SURGERY     CERVICAL FUSION     2011   LAMINECTOMY     1999   LUMBAR FUSION  1999   Social History   Occupational History   Not on file  Tobacco Use   Smoking status: Never   Smokeless tobacco: Never  Vaping Use   Vaping Use: Never used  Substance and Sexual Activity   Alcohol use: Never   Drug use: Never   Sexual activity: Not on file

## 2022-02-03 NOTE — Patient Instructions (Signed)
Plan:  No lifting greater than 10 lbs. Avoid bending, stooping and twisting. Use brace when sitting and out of bed even to go to bathroom. Walk in house for first 2 weeks then may start to get out slowly increasing distances up to one mile by 4-6 weeks post op.  Please call and return for scheduled follow up appointment 6 weeks from the time of surgery.

## 2022-03-17 ENCOUNTER — Ambulatory Visit: Payer: Medicare HMO | Admitting: Specialist

## 2022-03-17 ENCOUNTER — Ambulatory Visit: Payer: Self-pay

## 2022-03-17 ENCOUNTER — Encounter: Payer: Self-pay | Admitting: Specialist

## 2022-03-17 VITALS — BP 127/79 | HR 66 | Ht 64.5 in | Wt 203.0 lb

## 2022-03-17 DIAGNOSIS — Z981 Arthrodesis status: Secondary | ICD-10-CM

## 2022-03-17 DIAGNOSIS — W19XXXA Unspecified fall, initial encounter: Secondary | ICD-10-CM

## 2022-03-17 NOTE — Progress Notes (Signed)
   Post-Op Visit Note   Patient: Meghan Gallegos           Date of Birth: 30-Nov-1945           MRN: [id number] Visit Date: 03/17/2022 PCP: Benjaman Pott, MD   Assessment & Plan: 11.5 weeks post op Left L4-5 TLIF. Had her fall while trying to stand on the left leg on a sofa.   Chief Complaint:  Chief Complaint  Patient presents with   Lower Back - Routine Post Op  Had a fall onto a sofa when trying to close her window drapes during a storm with some discomfort in the left. Knee, was able to bear weight on the left leg. Pain in the left leg felt like nerve pain and used patches with area over the posterioir left knee. Bowel and bladder are normal  Visit Diagnoses:  1. History of lumbar fusion   Motor is normal  Pain with varus stress of the left LCL posteriorly. Tested left LCL and this seen to cause pain No swelling either leg  Plan: Avoid bending, stooping and twisting. Use brace when sitting and out of bed even to go to bathroom. Walk in house for first 2 weeks then may start to get out slowly increasing distances up to one mile by 4-6 weeks post op. After 5 days may shower and change dressing following bathing with shower.When bathing remove the brace shower and replace brace before getting out of the shower. Lumbar CT . Please call and return for scheduled follow up appointment 2 weeks from the time of surgery.    Follow-Up Instructions: No follow-ups on file.   Orders:  Orders Placed This Encounter  Procedures   XR Lumbar Spine 2-3 Views   No orders of the defined types were placed in this encounter.   Imaging: No results found.  PMFS History: Patient Active Problem List   Diagnosis Date Noted   Spinal stenosis of lumbar region with neurogenic claudication    Spondylolisthesis, lumbar region    Fusion of spine of lumbar region 12/27/2021   Past Medical History:  Diagnosis Date   Hypertension    Hyperthyroidism 07/2018   hyerthyroidism/subclilnical  hyperthyroidism 07/2018, possibly related to thyroiditis   Pneumonia     No family history on file.  Past Surgical History:  Procedure Laterality Date   BACK SURGERY     CERVICAL FUSION     2011   LAMINECTOMY     1999   LUMBAR FUSION  1999   Social History   Occupational History   Not on file  Tobacco Use   Smoking status: Never   Smokeless tobacco: Never  Vaping Use   Vaping Use: Never used  Substance and Sexual Activity   Alcohol use: Never   Drug use: Never   Sexual activity: Not on file

## 2022-03-30 ENCOUNTER — Telehealth: Payer: Self-pay | Admitting: Specialist

## 2022-03-30 NOTE — Telephone Encounter (Signed)
Called pt unable to leave vm. No vm set up. Pt need an appt with Nitka after 05/17/22

## 2022-04-03 IMAGING — MR MR LUMBAR SPINE WO/W CM
5 of 8 series · 25 of 48 positions shown · IV contrast (multihance)
Comparison: Radiograph dated September 24, 2021

CLINICAL DATA: Lumbar radiculopathy low back pain

EXAM:
MRI LUMBAR SPINE WITHOUT AND WITH CONTRAST
TECHNIQUE: Multiplanar and multiecho pulse sequences of the lumbar spine were
obtained without and with intravenous contrast.
CONTRAST:  19mL MULTIHANCE GADOBENATE DIMEGLUMINE 529 MG/ML IV SOLN

[Series 3: T2 · sagittal · 4.0mm · 0.53mm/px · 2 of 13 slices shown (1 of 2)]
[im 1/13]
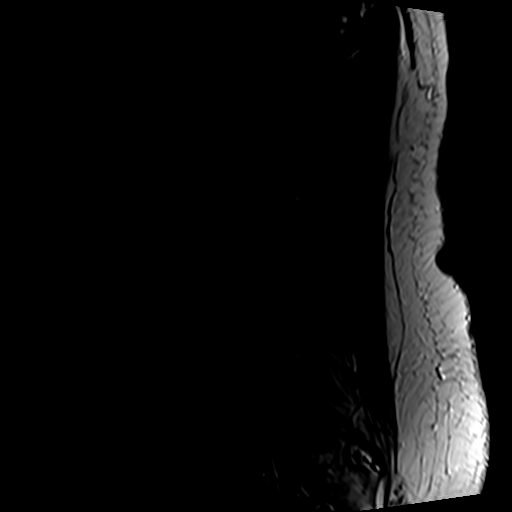
[im 13/13]
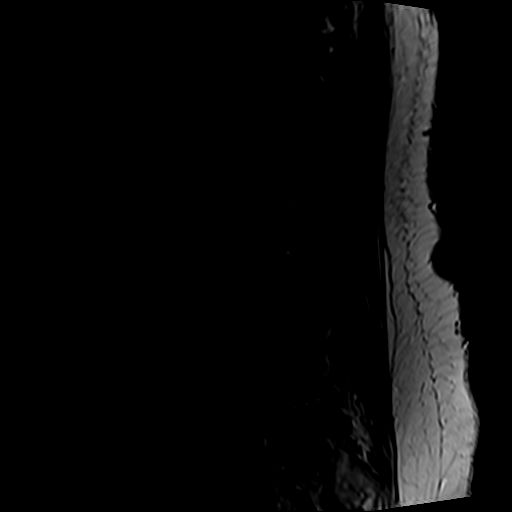

[Series 5: T1 · sagittal · 4.0mm · 0.53mm/px · 4 of 15 slices shown (1 of 2)]
[im 1/15]
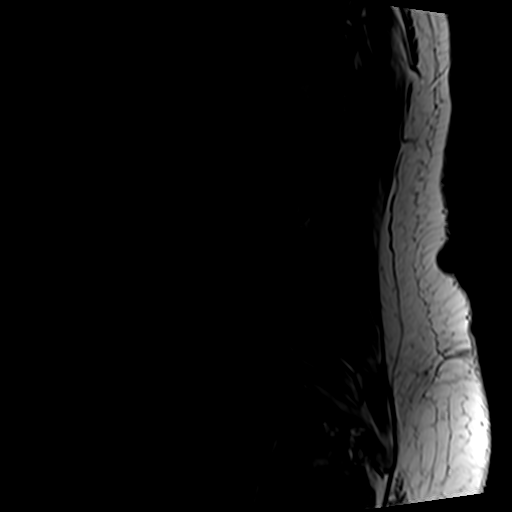
[im 5/15]
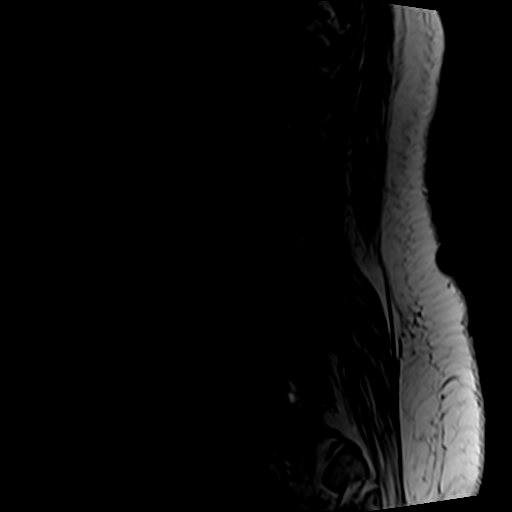
[im 10/15]
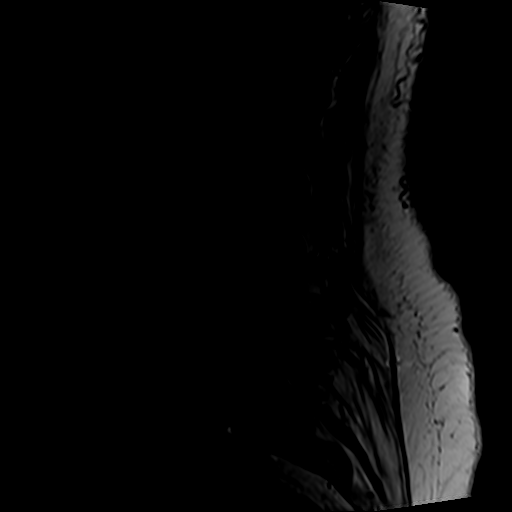
[im 15/15]
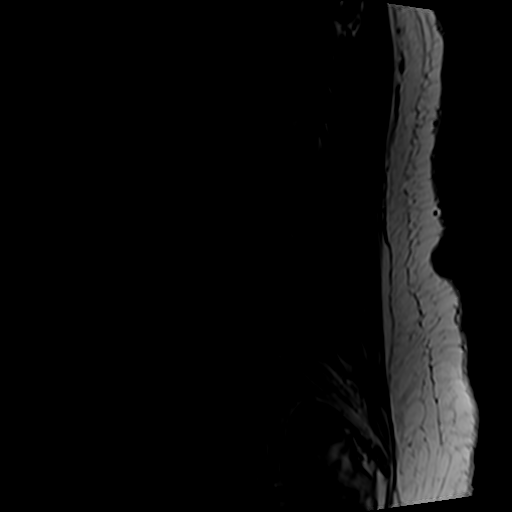

[Series 6: T2 · axial · 4.0mm · 0.70mm/px · z∈[-101,+88]mm · 8 of 37 slices shown (2 of 2)]
[im 1/37]
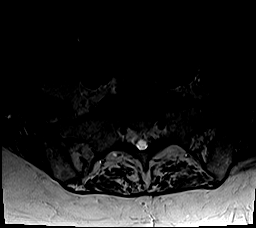
[im 5/37]
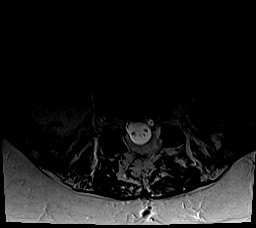
[im 13/37]
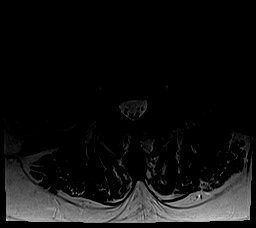
[im 17/37]
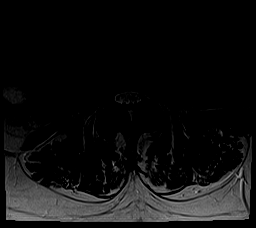
[im 21/37]
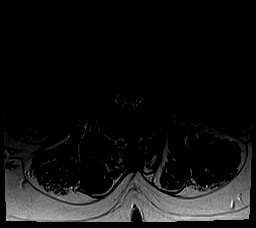
[im 25/37]
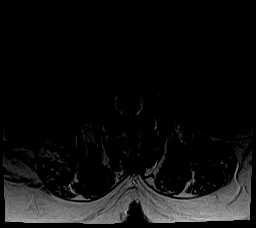
[im 33/37]
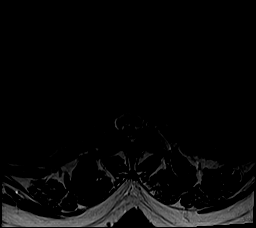
[im 37/37]
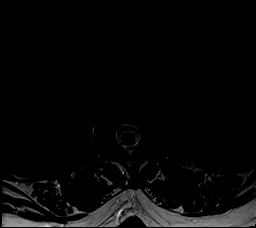

[Series 7: T1 · axial · 4.0mm · 0.35mm/px · z∈[-101,+88]mm · 8 of 37 slices shown (2 of 2)]
[im 1/37]
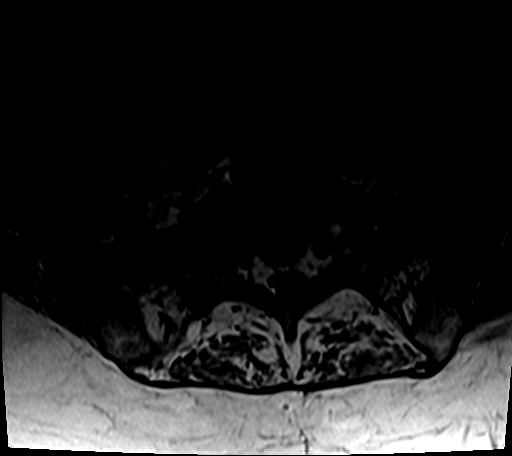
[im 5/37]
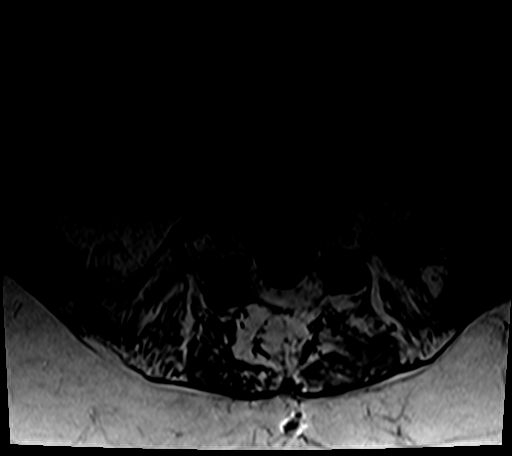
[im 13/37]
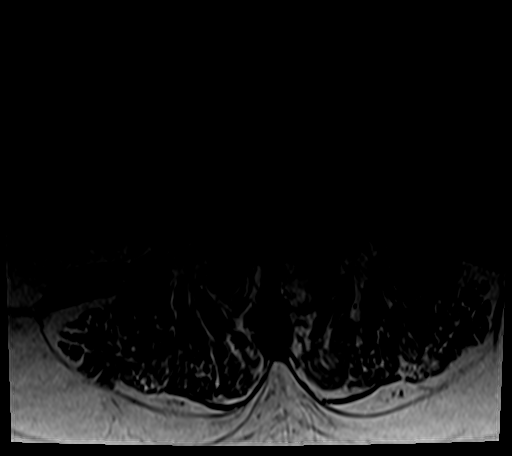
[im 17/37]
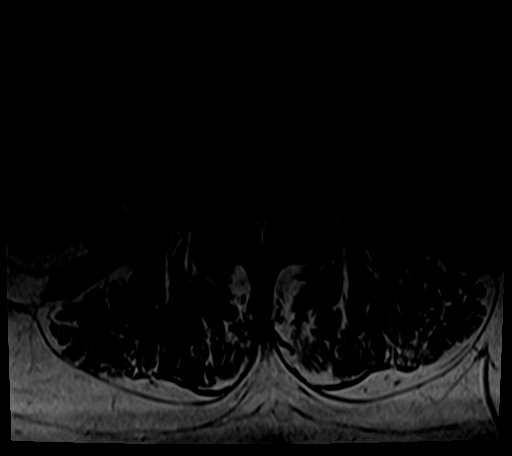
[im 21/37]
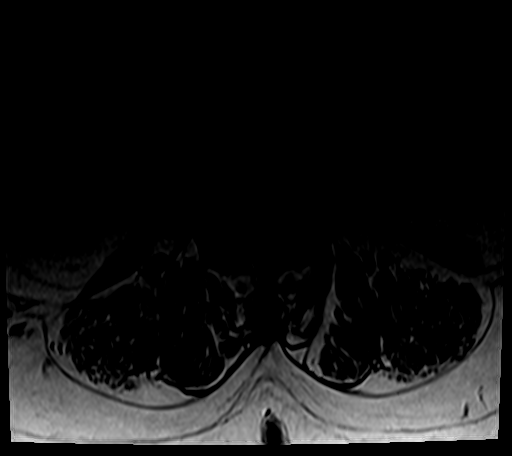
[im 25/37]
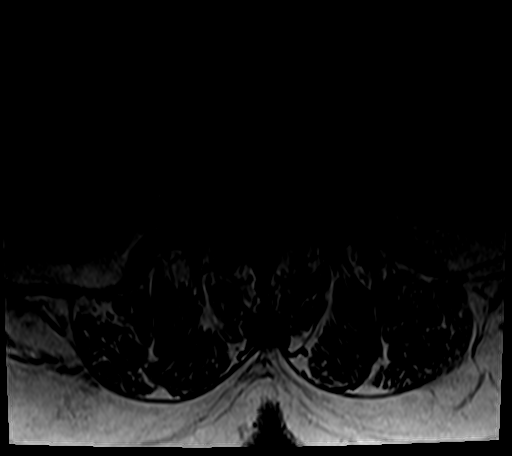
[im 33/37]
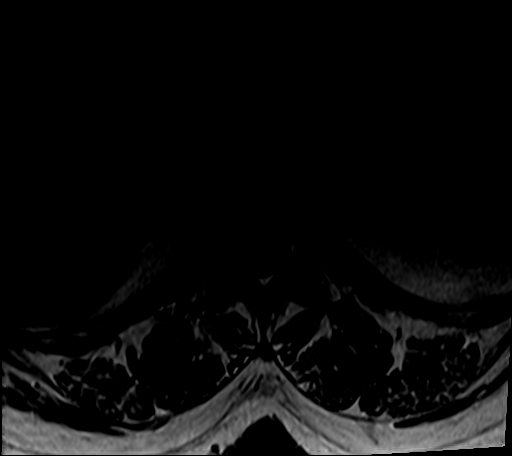
[im 37/37]
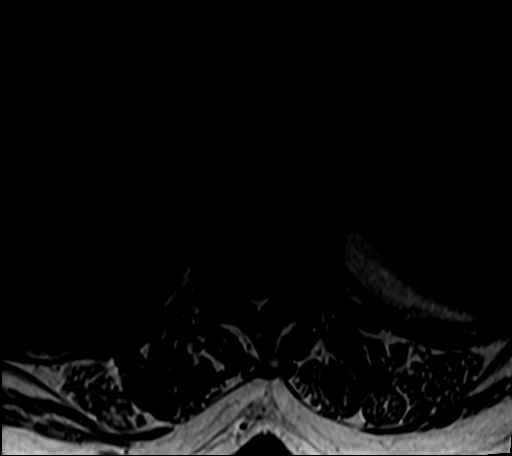

[Series 8: T1 fat-sat post-contrast · sagittal · 4.0mm · 0.53mm/px · 3 of 15 slices shown]
[im 1/15]
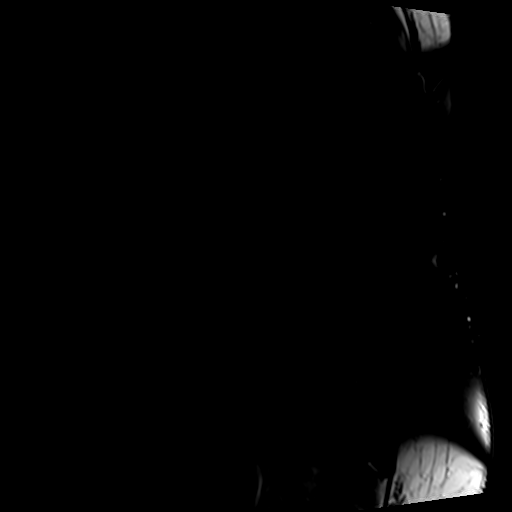
[im 5/15]
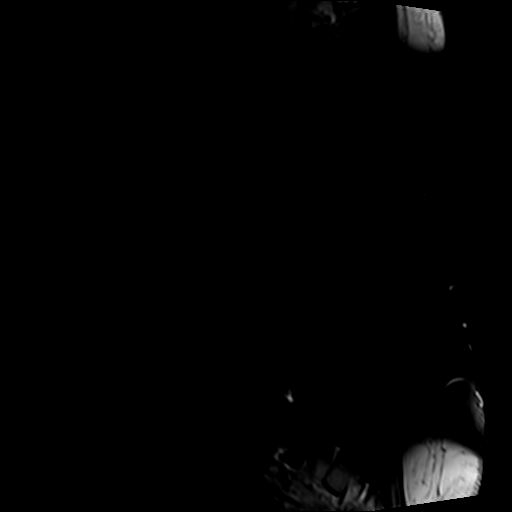
[im 10/15]
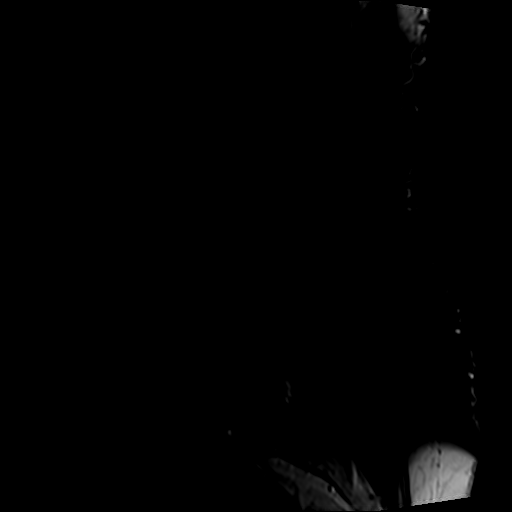

[25 of 48 positions shown; findings below may reference images not displayed]

FINDINGS: Segmentation:  Standard.

Alignment: Grade 1 anterolisthesis of L4 and mild retrolisthesis of
S1, unchanged.

Vertebrae:  No fracture, evidence of discitis, or bone lesion.

Conus medullaris and cauda equina: Conus extends to the L1-L2 level.
Conus and cauda equina appear normal.

Paraspinal and other soft tissues: Multiple bilateral renal cysts.
Paraspinal soft tissues are unremarkable.

Disc spaces:

T12-L1: No significant disc bulge. No neural foraminal stenosis. No
central canal stenosis.

L1-L2: No significant disc bulge. No neural foraminal stenosis. No
central canal stenosis.

L2-L3: No significant disc bulge. No neural foraminal stenosis. No
central canal stenosis. Bilateral facet joint arthropathy.

L3-L4: No significant disc bulge. No neural foraminal stenosis. No
central canal stenosis. Ligamentum flavum hypertrophy and mild facet
joint arthropathy.

L4-L5: 5 mm anterolisthesis of L4, unchanged. Marked facet joint
arthropathy. Ligamentum flavum hypertrophy with mild narrowing of
spinal canal. No neural foraminal narrowing.

L5-S1: Postsurgical changes with interbody cage fusion device in
place. Laminectomy changes. No spinal canal or neural foraminal
narrowing.
IMPRESSION: 1. Grade 1 anterolisthesis of L4-L5 with ligamentum flavum
hypertrophy and mild narrowing of spinal canal. No significant
neural foraminal narrowing.

2. Postsurgical changes with intact hardware. No significant spinal
canal or neural foraminal narrowing at L5-S1.

3. No significant changes since prior MRI examination. No abnormal
enhancement.

## 2022-04-04 ENCOUNTER — Other Ambulatory Visit: Payer: Medicare HMO

## 2022-04-27 ENCOUNTER — Ambulatory Visit
Admission: RE | Admit: 2022-04-27 | Discharge: 2022-04-27 | Disposition: A | Discharge status: Home / Self Care | Payer: Medicare HMO | Source: Ambulatory Visit | Attending: Specialist | Admitting: Specialist

## 2022-04-27 DIAGNOSIS — Z981 Arthrodesis status: Secondary | ICD-10-CM

## 2022-04-27 DIAGNOSIS — W19XXXA Unspecified fall, initial encounter: Secondary | ICD-10-CM

## 2022-05-16 ENCOUNTER — Ambulatory Visit (INDEPENDENT_AMBULATORY_CARE_PROVIDER_SITE_OTHER): Payer: Medicare HMO

## 2022-05-16 ENCOUNTER — Encounter: Payer: Self-pay | Admitting: Specialist

## 2022-05-16 ENCOUNTER — Ambulatory Visit: Payer: Medicare HMO | Admitting: Specialist

## 2022-05-16 VITALS — BP 127/68 | HR 71 | Ht 64.5 in | Wt 190.0 lb

## 2022-05-16 DIAGNOSIS — M5146 Schmorl's nodes, lumbar region: Secondary | ICD-10-CM

## 2022-05-16 DIAGNOSIS — M4316 Spondylolisthesis, lumbar region: Secondary | ICD-10-CM | POA: Diagnosis not present

## 2022-05-16 DIAGNOSIS — Z981 Arthrodesis status: Secondary | ICD-10-CM

## 2022-05-16 DIAGNOSIS — M8580 Other specified disorders of bone density and structure, unspecified site: Secondary | ICD-10-CM

## 2022-05-16 DIAGNOSIS — S32040A Wedge compression fracture of fourth lumbar vertebra, initial encounter for closed fracture: Secondary | ICD-10-CM

## 2022-05-16 DIAGNOSIS — M5136 Other intervertebral disc degeneration, lumbar region: Secondary | ICD-10-CM | POA: Diagnosis not present

## 2022-05-16 NOTE — Progress Notes (Signed)
Office Visit Note   Patient: Meghan Gallegos           Date of Birth: [birth year]           MRN: [id number] Visit Date: 05/16/2022              Requested by: Audie Box, MD No address on file PCP: Audie Box, MD   Assessment & Plan: Visit Diagnoses:  1. History of lumbar fusion   2. Spondylolisthesis, lumbar region   3. Degenerative disc disease, lumbar   4. Node, Schmorl's, lumbar   5. Compression fracture of L4 vertebra, initial encounter (HCC)     Plan: Avoid frequent bending and stooping  No lifting greater than 15-20 lbs. May use ice or moist heat for pain. Weight loss is of benefit. Best medication for lumbar disc disease is arthritis medications like motrin, celebrex and naprosyn. Exercise is important to improve your indurance and does allow people to function better inspite of back pain. Recommend VItamin D level as the adjacent endplates of the L4 vertebrae suggest that they are partially collapsed, either due to trauma and fall or stress and weakened bone strength.    Follow-Up Instructions: No follow-ups on file.   Orders:  Orders Placed This Encounter  Procedures   XR Lumbar Spine 2-3 Views   No orders of the defined types were placed in this encounter.     Procedures: No procedures performed   Clinical Data: No additional findings.   Subjective: Chief Complaint  Patient presents with   Lower Back - Routine Post Op, Follow-up    12/27/2021 Left L4-56 TLIF    76 year old female with history of L4-5 TLIF for spondylolisthesis above L5-S1 anterior interbody fusion done at a hospital in [place]. She is still requiring meds for pain including use of Morphine Sulfate 15 mg BID and hydrocodone 10 mg up to 4 times a day . She underwent a recent CT of the lumbar spine to assess for nerve compression. The study is reviewed and shows new schmorl's node at the L4 superior endplate with new vacuum sign at L4-5. No definite bridging of bone at L4-5 anteriorly  but some arthrodesis across the facets.     Review of Systems  Constitutional: Negative.   HENT: Negative.    Eyes: Negative.   Respiratory: Negative.    Cardiovascular: Negative.   Gastrointestinal: Negative.   Endocrine: Negative.   Genitourinary: Negative.   Musculoskeletal: Negative.   Skin: Negative.   Allergic/Immunologic: Negative.   Neurological: Negative.   Hematological: Negative.   Psychiatric/Behavioral: Negative.       Objective: Vital Signs: BP 127/68   Pulse 71   Ht 5' 4.5" (1.638 m)   Wt 190 lb (86.2 kg)   BMI 32.11 kg/m   Physical Exam Constitutional:      Appearance: She is well-developed.  HENT:     Head: Normocephalic and atraumatic.  Eyes:     Pupils: Pupils are equal, round, and reactive to light.  Pulmonary:     Effort: Pulmonary effort is normal.     Breath sounds: Normal breath sounds.  Abdominal:     General: Bowel sounds are normal.     Palpations: Abdomen is soft.  Musculoskeletal:     Cervical back: Normal range of motion and neck supple.     Lumbar back: Negative right straight leg raise test and negative left straight leg raise test.  Skin:    General: Skin is warm and  dry.  Neurological:     Mental Status: She is alert and oriented to person, place, and time.  Psychiatric:        Behavior: Behavior normal.        Thought Content: Thought content normal.        Judgment: Judgment normal.     Back Exam   Tenderness  The patient is experiencing tenderness in the lumbar.  Range of Motion  Extension:  abnormal  Flexion:  abnormal  Lateral bend right:  abnormal  Lateral bend left:  abnormal  Rotation right:  abnormal  Rotation left:  abnormal   Muscle Strength  Right Quadriceps:  5/5  Left Quadriceps:  5/5  Right Hamstrings:  5/5  Left Hamstrings:  5/5   Tests  Straight leg raise right: negative Straight leg raise left: negative  Reflexes  Patellar:  2/4 Achilles:  2/4  Other  Toe walk: normal Heel walk:  normal Erythema: no back redness Scars: present      Specialty Comments:  No specialty comments available.  Imaging: No results found.   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 No pertinent family history.  Past Surgical History:  Procedure Laterality Date   BACK SURGERY     CERVICAL FUSION     2011   [place]   Social History   Occupational History   Not on file  Tobacco Use   Smoking status: Never   Smokeless tobacco: Never  Vaping Use   Vaping Use: Never used  Substance and Sexual Activity   Alcohol use: Never   Drug use: Never   Sexual activity: Not on file

## 2022-05-16 NOTE — Patient Instructions (Signed)
Plan: Avoid frequent bending and stooping  No lifting greater than 15-20 lbs. May use ice or moist heat for pain. Weight loss is of benefit. Best medication for lumbar disc disease is arthritis medications like motrin, celebrex and naprosyn. Exercise is important to improve your indurance and does allow people to function better inspite of back pain. Recommend VItamin D level as the adjacent endplates of the L4 vertebrae suggest that they are partially collapsed, either due to trauma and fall or stress and weakened bone strength.

## 2022-05-17 LAB — VITAMIN D 25 HYDROXY (VIT D DEFICIENCY, FRACTURES): Vit D, 25-Hydroxy: 36 ng/mL (ref 30–100)

## 2022-07-04 ENCOUNTER — Other Ambulatory Visit: Payer: Self-pay | Admitting: Specialist

## 2022-07-05 ENCOUNTER — Other Ambulatory Visit (HOSPITAL_COMMUNITY): Payer: Self-pay

## 2022-07-05 MED ORDER — FERROUS GLUCONATE 324 (38 FE) MG PO TABS
324.0000 mg | ORAL_TABLET | Freq: Two times a day (BID) | ORAL | 0 refills | Status: DC
  Filled 2022-07-05: qty 60, 30d supply, fill #0

## 2022-07-20 ENCOUNTER — Ambulatory Visit: Payer: Medicare HMO | Admitting: Orthopedic Surgery

## 2022-07-29 ENCOUNTER — Other Ambulatory Visit: Payer: Self-pay | Admitting: Radiology

## 2022-07-29 ENCOUNTER — Ambulatory Visit: Payer: Medicare HMO | Admitting: Orthopedic Surgery

## 2022-07-29 ENCOUNTER — Ambulatory Visit (INDEPENDENT_AMBULATORY_CARE_PROVIDER_SITE_OTHER): Payer: Medicare HMO

## 2022-07-29 ENCOUNTER — Encounter: Payer: Self-pay | Admitting: Orthopedic Surgery

## 2022-07-29 VITALS — BP 127/78 | HR 74 | Ht 64.5 in | Wt 190.0 lb

## 2022-07-29 DIAGNOSIS — Z981 Arthrodesis status: Secondary | ICD-10-CM

## 2022-07-29 MED ORDER — FERROUS GLUCONATE 324 (38 FE) MG PO TABS: 324.0000 mg | ORAL_TABLET | Freq: Two times a day (BID) | ORAL | 3 refills | Status: AC

## 2022-07-29 NOTE — Progress Notes (Signed)
Orthopedic Spine Surgery Office Note  Assessment: Patient is a 76 y.o. female who underwent L4/5 TLIF and posterior instrumented spinal fusion with Dr. Otelia Sergeant on 12/27/2021.  She was recovering as expected after surgery but then had a fall in June.  She noted acute worsening of her back pain and left posterior thigh pain.  She has been slowly getting better with time.   Plan: -We went over her DEXA scan that was obtained at [facility] that showed osteopenia with a T-score of -1.6.  I recommended that she use vitamin D and calcium supplementation. -In regards to her pain, it is slowly been getting better so we are going to continue to monitor it.  She wanted to try some physical therapy.  I think this is a reasonable treatment so referral was provided to her.  She should work on core strengthening -Patient should return to office in 12 weeks, x-rays at next visit: lumbar AP/lateral/flex/ex   Patient expressed understanding of the plan and all questions were answered to the patient's satisfaction.   ___________________________________________________________________________   History:  Patient is a 76 y.o. female who presents today for lumbar spine.  Patient underwent L4/5 TLIF and posterior instrumented spinal fusion with Dr. Otelia Sergeant on 12/27/2021.  She was doing well after surgery but then sustained a fall in June 2023.  After the fall, she noted worsening back pain and left lower extremity pain.  The pain is felt on the lower back and in the posterior aspect of the left thigh.  No symptoms on the right side.  Over the last couple of months, the pain is progressively improved.  She is still not been able to be as active as she would like to be because of the pain.  She is using narcotics to help with her pain.  She is using less narcotics with time.  She is interested in and asked about doing physical therapy to help her as well.  Noticed weakness in her left leg and that has gotten slightly better with time.   No weakness noted in her right leg.   Weakness: Left leg feels slightly weaker than the contralateral side.  No other weakness noted Symptoms of imbalance: Denies Paresthesias and numbness: Denies Bowel or bladder incontinence: Denies Saddle anesthesia: Denies  Treatments tried: Activity modification, pain medications  Review of systems: Denies fevers and chills, night sweats, unexplained weight loss, history of cancer, pain that wakes them at night  Past medical history: HTN Hyperthyroidism Chronic pain  Allergies: NKDA  Past surgical history:  L5-S1 decompression and interbody fusion L4/5 TLIF and posterior instrumented spinal fusion ACDF  Social history: Denies use of nicotine product (smoking, vaping, patches, smokeless) Alcohol use: denies Denies recreational drug use   Physical Exam:  General: no acute distress, appears stated age Neurologic: alert, answering questions appropriately, following commands Respiratory: unlabored breathing on room air, symmetric chest rise Psychiatric: appropriate affect, normal cadence to speech   MSK (spine):  -Strength exam      Left  Right EHL    5/5  5/5 TA    5/5  5/5 GSC    5/5  5/5 Knee extension  5/5  5/5 Hip flexion   5/5  5/5  -Sensory exam    Sensation intact to light touch in L3-S1 nerve distributions of bilateral lower extremities  -Achilles DTR: 1/4 on the left, 1/4 on the right -Patellar tendon DTR: 1/4 on the left, 1/4 on the right  -Straight leg raise: negative -Contralateral straight leg  raise: negative -Clonus: no beats bilaterally  -Left hip exam: No pain through range of motion, negative Stinchfield, negative Faber -Right hip exam: No pain through range of motion, negative Stinchfield, negative Faber  Imaging: XR of the lumbar spine from 07/29/2022 and 05/16/2022 was independently reviewed and interpreted, showing L4 screws in cranially and traversing the superior endplate of the L4 vertebral  body into the L3-4 disc base.  There is no lucency seen around either the L4 or L5 screws.  There is no lucency seen around the interbody devices at L4-5 and L5-S1.  There is subsidence of the L4-5 interbody device.  CT of the lumbar spine from 04/27/2022 was independently reviewed and interpreted, showing subsidence of the L4-5 interbody device and lucency around it cranially.  The L4 screws have penetrated the superior endplate of the L4 body and are in the L3-4 disc space.  There is no lucency around the L4 or L5 screws.  There is no lucency around the L5/S1 interbody device.  Anterior fusion seen across the L5/S1 disc space.  No fusion mass seen through the L4-5 disc space.  There does appear to be fusion through the facet joints. SEP fx at L4. Decompression at L5/S1. Facetectomy on the left at L4/5.    Patient name: Meghan Gallegos Patient MRN: [id number] Date of visit: 07/29/22

## 2022-10-11 ENCOUNTER — Ambulatory Visit (INDEPENDENT_AMBULATORY_CARE_PROVIDER_SITE_OTHER): Payer: Medicare HMO

## 2022-10-11 ENCOUNTER — Ambulatory Visit: Payer: Medicare HMO | Admitting: Orthopedic Surgery

## 2022-10-11 DIAGNOSIS — M542 Cervicalgia: Secondary | ICD-10-CM

## 2022-10-11 DIAGNOSIS — Z981 Arthrodesis status: Secondary | ICD-10-CM

## 2022-10-11 NOTE — Progress Notes (Signed)
Orthopedic Spine Surgery Office Note  Assessment: Patient is a 77 y.o. female with low back and neck pain that is improving after a fall. No fracture seen on XR. No midline tenderness to palpation or with rotation.    Plan: -Gradually return to full activity -No advanced imaging needed at this time -Continue with OTC pain medications -Patient has tried heat/ice, activity modification, OTC medications -If she does not continue to get better, she will return to the office sooner -Will have her come back for routine follow up for her back -Patient should return to office in 11 weeks (~1 year from lumbar surgery with Dr. Louanne Skye), x-rays at next visit: AP/lateral/flex/ex lumbar   Patient expressed understanding of the plan and all questions were answered to the patient's satisfaction.   __________________________________________________________________________  History: Patient is a 77 y.o. female who has been previously seen in the office for follow up after her surgery with Dr. Louanne Skye. She comes in today because she had a ground level fall at her son's wedding reception. She said her foot got caught on a break in the floor and she fell to the ground. Noted acute onset of neck and low back pain. She has had burning sensation on the dorsum of the left foot. That burning pain got worse after the fall. Her neck and low back pain have gradually improved with time. She is starting to be able to do more. She wanted to come in today to make sure she did not damage her prior surgeries.   Previous treatments: heat/ice, activity modification, OTC medications  Physical Exam:  General: no acute distress, appears stated age Neurologic: alert, answering questions appropriately, following commands Respiratory: unlabored breathing on room air, symmetric chest rise Psychiatric: appropriate affect, normal cadence to speech   MSK (spine):  -Strength exam      Left  Right Grip strength                 5/5  5/5 Interosseus   5/5   5/5 Wrist extension  5/5  5/5 Wrist flexion   5/5  5/5 Elbow flexion   5/5  5/5 Deltoid    5/5  5/5  EHL    5/5  5/5 TA    5/5  5/5 GSC    5/5  5/5 Knee extension  5/5  5/5 Hip flexion   5/5  5/5  -Sensory exam    Sensation intact to light touch in L3-S1 nerve distributions of bilateral lower extremities  Sensation intact to light touch in C5-T1 nerve distributions of bilateral upper extremities  -Brachioradialis DTR: 1/4 on the left, 1/4 on the right -Biceps DTR: 1/4 on the left, 1/4 on the right -Achilles DTR: 1/4 on the left, 1/4 on the right -Patellar tendon DTR: 1/4 on the left, 1/4 on the right  -No pain with rotation to 45 degrees in each direction at the neck, no midline tenderness to palpation -No pain with rotation or flexion/extension in the lumbar spine, no midline tenderness to palpation over the lumbar spine   Left shoulder exam: no pain through range of motion Right shoulder exam: no pain through range of motion   Imaging: XR of the cervical spine from 10/11/2022 was independently reviewed and interpreted, showing C5-7 ACDF with anterior instrumentation. No lucency around the instrumentation. No backing out of the instrumentation seen. Cages in appropriate position. No fracture or dislocation seen.   XR of the lumbar spine from 10/11/2022 was independently reviewed and interpreted, showing interbody device  at L5/S1 that has not changed in position. There is no lucency around the cage. L4 screws have breached the superior endplate but that appears similar to prior films. L4/5 interbody device has subsided into the L4 vertebral body. No lucency around the posterior instrumentation. No backing out of the screws. No fracture or dislocation seen.     Patient name: Meghan Gallegos Patient MRN: [id number] Date of visit: 10/11/22

## 2022-10-27 ENCOUNTER — Ambulatory Visit: Payer: Medicare HMO | Admitting: Orthopedic Surgery

## 2023-01-23 ENCOUNTER — Ambulatory Visit: Payer: Medicare HMO | Admitting: Orthopedic Surgery

## 2023-01-23 ENCOUNTER — Other Ambulatory Visit (INDEPENDENT_AMBULATORY_CARE_PROVIDER_SITE_OTHER): Payer: Medicare HMO

## 2023-01-23 DIAGNOSIS — Z981 Arthrodesis status: Secondary | ICD-10-CM

## 2023-01-23 NOTE — Progress Notes (Signed)
Orthopedic Spine Surgery Office Note   Assessment: Patient is a 77 y.o. female who underwent L4/5 TLIF and PSIF with Dr. Otelia Sergeant about 1 year ago who has been doing well since surgery     Plan: -No acute operative intervention -Since she is getting relief with gabapentin, I told her to continue to take that -Activity as tolerated -No spine specific precautions -Patient should return to office on an as needed basis     Patient expressed understanding of the plan and all questions were answered to the patient's satisfaction.    __________________________________________________________________________   History: Patient is a 77 y.o. female who had an L4/5 TLIF and PSIF with Dr. Otelia Sergeant (12/27/2021) who comes in today for routine follow up after surgery.  Patient has been doing well since last time she was seen.  She is not having any back pain.  No pain radiating into either lower extremity.  She has a burning type pain in the middle of the plantar aspect of her foot.  This started within the last 2 months.  She says that the gabapentin helps with the pain significantly and makes it tolerable.  There is no trauma or injury that preceded the onset of this foot pain.  She feels that is slowly getting better.  Still has decreased sensation on the lateral aspect of her left foot.  Denies other numbness or paresthesias.   Previous treatments: heat/ice, activity modification, gabapentin, OTC medications   Physical Exam:   General: no acute distress, appears stated age Neurologic: alert, answering questions appropriately, following commands Respiratory: unlabored breathing on room air, symmetric chest rise Psychiatric: appropriate affect, normal cadence to speech     MSK (spine):   -Strength exam                                                   Left                  Right   EHL                              5/5                  5/5 TA                                 5/5                   5/5 GSC                             5/5                  5/5 Knee extension            5/5                  5/5 Hip flexion                    5/5                  5/5   -Sensory exam  Sensation intact to light touch in L3-S1 nerve distributions of bilateral lower extremities   -Brachioradialis DTR: 1/4 on the left, 1/4 on the right -Biceps DTR: 1/4 on the left, 1/4 on the right   Left hip exam: no pain through range of motion Right hip exam: no pain through range of motion     Imaging: XR of the lumbar spine from 01/13/2023 was independently reviewed and interpreted, showing interbody device at L5/S1 that has not changed in position. There is no lucency around the cage. L4 screws have breached the superior endplate and appear to be within the disc space on the lateral. L4/5 interbody device has subsided into the L4 vertebral body. No lucency around the posterior instrumentation. Screws have not backed out. No fracture or dislocation seen. No evidence of instability on flexion/extension views.        Patient name: Meghan Gallegos Patient MRN: [id number] Date of visit: 01/23/23

## 2023-05-24 ENCOUNTER — Encounter: Payer: Self-pay | Admitting: Orthopedic Surgery
